# Patient Record
Sex: Male | Born: 1960 | Race: White | Hispanic: No | Marital: Married | Smoking: Never smoker
Health system: Southern US, Community
[De-identification: ages and names within clinical notes are randomized; demographics above are authoritative.]

## PROBLEM LIST (undated history)

## (undated) DIAGNOSIS — T783XXA Angioneurotic edema, initial encounter: Secondary | ICD-10-CM

## (undated) HISTORY — DX: Angioneurotic edema, initial encounter: T78.3XXA

## (undated) HISTORY — PX: TONSILLECTOMY: SUR1361

## (undated) HISTORY — PX: ADENOIDECTOMY: SUR15

---

## 2019-10-06 ENCOUNTER — Other Ambulatory Visit: Payer: Self-pay

## 2020-10-16 DIAGNOSIS — U071 COVID-19: Secondary | ICD-10-CM

## 2020-10-16 HISTORY — DX: COVID-19: U07.1

## 2020-10-19 ENCOUNTER — Other Ambulatory Visit: Payer: Self-pay

## 2020-10-19 ENCOUNTER — Emergency Department
Admission: RE | Admit: 2020-10-19 | Discharge: 2020-10-19 | Disposition: A | Discharge status: Home / Self Care | Payer: 59 | Source: Ambulatory Visit

## 2020-10-19 VITALS — BP 141/92 | HR 110 | Temp 98.4°F | Resp 18

## 2020-10-19 DIAGNOSIS — J0100 Acute maxillary sinusitis, unspecified: Secondary | ICD-10-CM

## 2020-10-19 DIAGNOSIS — U071 COVID-19: Secondary | ICD-10-CM

## 2020-10-19 MED ORDER — AMOXICILLIN-POT CLAVULANATE 875-125 MG PO TABS: 1.0000 | ORAL_TABLET | Freq: Two times a day (BID) | ORAL | 0 refills | Status: DC

## 2020-10-19 NOTE — ED Triage Notes (Signed)
Pt c/o covid sxs (sore throat, productive cough and nasal congestion) since Friday. Took at home test on Saturday am. which came back pos. Felt better Wednesday but getting worse yesterday and today.  Taking mucinex prn.

## 2020-10-19 NOTE — Discharge Instructions (Signed)
  Please take antibiotics as prescribed and be sure to complete entire course even if you start to feel better to ensure infection does not come back.  You may take 500mg  acetaminophen every 4-6 hours or in combination with ibuprofen 400-600mg  every 6-8 hours as needed for pain, inflammation, and fever.  Be sure to well hydrated with clear liquids and get at least 8 hours of sleep at night, preferably more while sick.   Please follow up with family medicine in 1 week if needed or schedule an appointment next week with the [facility] in [place] for in-person evaluation.   CDC recommends isolation for 5 days from symptom onset and fever free for 24 hours without use of fever reducing medication and symptoms improving. Then wear a well fitting mask for 5 days when around others. If not improving, isolate for 10 days and consider reevaluation by a medical provider to rule out a secondary bacterial infection.   Call 911 or have someone drive you to the hospital if symptoms significantly worsening- chest pain, trouble breathing, severe headache, dizziness/passing out, or other new concerning symptoms develop.

## 2020-10-19 NOTE — ED Provider Notes (Signed)
[facility]    CSN: [id number] Arrival date & time: 10/19/20  1050      History   Chief Complaint Chief Complaint  Patient presents with  . Cough    Appt 11am  . Nasal Congestion    HPI JUANITO GONYER is a 60 y.o. male.   HPI LEILAND MIHELICH is a 60 y.o. male presenting to UC with c/o 1 week of worsening sore throat, nasal congestion and sinus pressure/pain.  He took a home COVID test several times since symptom onset, had a POSITIVE result 3 days ago.  Denies fever, chills, chest pain or SOB. No n/v/d. He ahs taken mucinex and tylenol with mild relief. He had two doses of COVID vaccine in April 2021. Others at work have been sick recently.    History reviewed. No pertinent past medical history.  There are no problems to display for this patient.   History reviewed. No pertinent surgical history.     Home Medications    Prior to Admission medications   Medication Sig Start Date End Date Taking? Authorizing Provider  amoxicillin-clavulanate (AUGMENTIN) 875-125 MG tablet Take 1 tablet by mouth 2 (two) times daily. One po bid x 7 days 10/19/20  Yes Lurene Shadow, PA-C    Family History History reviewed. No pertinent family history.  Social History Social History   Tobacco Use  . Smoking status: Never Smoker  . Smokeless tobacco: Never Used  Vaping Use  . Vaping Use: Never used  Substance Use Topics  . Alcohol use: Yes    Comment: socially     Allergies   Patient has no known allergies.   Review of Systems Review of Systems  Constitutional: Negative for chills and fever.  HENT: Positive for congestion, ear pain (bilateral pressure), sinus pressure, sinus pain and sore throat. Negative for trouble swallowing and voice change.   Respiratory: Positive for cough. Negative for shortness of breath.   Cardiovascular: Negative for chest pain and palpitations.  Gastrointestinal: Negative for abdominal pain, diarrhea, nausea and vomiting.   Musculoskeletal: Negative for arthralgias, back pain and myalgias.  Skin: Negative for rash.  Neurological: Positive for headaches (frontal). Negative for dizziness and light-headedness.  All other systems reviewed and are negative.    Physical Exam Triage Vital Signs ED Triage Vitals  Enc Vitals Group     BP 10/19/20 1106 (!) 141/92     Pulse Rate 10/19/20 1106 (!) 110     Resp 10/19/20 1106 18     Temp 10/19/20 1106 98.4 F (36.9 C)     Temp Source 10/19/20 1106 Oral     SpO2 10/19/20 1106 95 %     Weight --      Height --      Head Circumference --      Peak Flow --      Pain Score 10/19/20 1107 7     Pain Loc --      Pain Edu? --      Excl. in GC? --    No data found.  Updated Vital Signs BP (!) 141/92 (BP Location: Left Arm)   Pulse (!) 110   Temp 98.4 F (36.9 C) (Oral)   Resp 18   SpO2 95%   Visual Acuity Right Eye Distance:   Left Eye Distance:   Bilateral Distance:    Right Eye Near:   Left Eye Near:    Bilateral Near:     Physical Exam Vitals and nursing note  reviewed.  Constitutional:      General: He is not in acute distress.    Appearance: Normal appearance. He is well-developed and well-nourished. He is not ill-appearing, toxic-appearing or diaphoretic.  HENT:     Head: Normocephalic and atraumatic.     Right Ear: Tympanic membrane and ear canal normal.     Left Ear: Tympanic membrane and ear canal normal.     Nose: Congestion present.     Right Sinus: Maxillary sinus tenderness present. No frontal sinus tenderness.     Left Sinus: Maxillary sinus tenderness present. No frontal sinus tenderness.     Mouth/Throat:     Lips: Pink.     Mouth: Mucous membranes are moist.     Pharynx: Oropharynx is clear. Uvula midline. No pharyngeal swelling, oropharyngeal exudate, posterior oropharyngeal erythema or uvula swelling.  Eyes:     Extraocular Movements: EOM normal.  Cardiovascular:     Rate and Rhythm: Normal rate and regular rhythm.   Pulmonary:     Effort: Pulmonary effort is normal. No respiratory distress.     Breath sounds: Normal breath sounds. No stridor. No wheezing, rhonchi or rales.  Musculoskeletal:        General: Normal range of motion.     Cervical back: Normal range of motion and neck supple.  Skin:    General: Skin is warm and dry.  Neurological:     Mental Status: He is alert and oriented to person, place, and time.  Psychiatric:        Mood and Affect: Mood and affect normal.        Behavior: Behavior normal.      UC Treatments / Results  Labs (all labs ordered are listed, but only abnormal results are displayed) Labs Reviewed - No data to display  EKG   Radiology No results found.  Procedures Procedures (including critical care time)  Medications Ordered in UC Medications - No data to display  Initial Impression / Assessment and Plan / UC Course  I have reviewed the triage vital signs and the nursing notes.  Pertinent labs & imaging results that were available during my care of the patient were reviewed by me and considered in my medical decision making (see chart for details).     Recent home COVID test positive Worsening sinus pain and pressure, will cover for secondary bacterial infection F/u with PCP or COVID Clinic next week as needed Discussed symptoms that warrant emergent care in the ED. AVS given  Final Clinical Impressions(s) / UC Diagnoses   Final diagnoses:  COVID  Acute non-recurrent maxillary sinusitis     Discharge Instructions      Please take antibiotics as prescribed and be sure to complete entire course even if you start to feel better to ensure infection does not come back.  You may take 500mg  acetaminophen every 4-6 hours or in combination with ibuprofen 400-600mg  every 6-8 hours as needed for pain, inflammation, and fever.  Be sure to well hydrated with clear liquids and get at least 8 hours of sleep at night, preferably more while sick.    Please follow up with family medicine in 1 week if needed or schedule an appointment next week with the [facility] in [place] for in-person evaluation.   CDC recommends isolation for 5 days from symptom onset and fever free for 24 hours without use of fever reducing medication and symptoms improving. Then wear a well fitting mask for 5 days when around others. If  not improving, isolate for 10 days and consider reevaluation by a medical provider to rule out a secondary bacterial infection.   Call 911 or have someone drive you to the hospital if symptoms significantly worsening- chest pain, trouble breathing, severe headache, dizziness/passing out, or other new concerning symptoms develop.     ED Prescriptions    Medication Sig Dispense Auth. Provider   amoxicillin-clavulanate (AUGMENTIN) 875-125 MG tablet Take 1 tablet by mouth 2 (two) times daily. One po bid x 7 days 14 tablet Lurene Shadow, [place]     PDMP not reviewed this encounter.   Lurene Shadow, [place] 10/19/20 1304

## 2020-10-26 ENCOUNTER — Emergency Department
Admission: EM | Admit: 2020-10-26 | Discharge: 2020-10-26 | Disposition: A | Discharge status: Home / Self Care | Payer: 59 | Source: Home / Self Care

## 2020-10-26 ENCOUNTER — Other Ambulatory Visit: Payer: Self-pay

## 2020-10-26 DIAGNOSIS — J320 Chronic maxillary sinusitis: Secondary | ICD-10-CM

## 2020-10-26 MED ORDER — CEFDINIR 300 MG PO CAPS: 300.0000 mg | ORAL_CAPSULE | Freq: Two times a day (BID) | ORAL | 0 refills | Status: DC

## 2020-10-26 MED ORDER — PREDNISONE 10 MG PO TABS: 20.0000 mg | ORAL_TABLET | Freq: Every day | ORAL | 0 refills | Status: DC

## 2020-10-26 NOTE — ED Triage Notes (Signed)
Patient presents to Urgent Care with complaints of continued sinus pressure, sore throat, and cough since he got covid just over 2 weeks ago. Patient reports he was given an antibiotic last week and he just completed it, thinks he might need another course of antibiotics to get over covid.

## 2021-01-07 ENCOUNTER — Other Ambulatory Visit: Payer: Self-pay

## 2021-01-07 ENCOUNTER — Emergency Department
Admission: RE | Admit: 2021-01-07 | Discharge: 2021-01-07 | Disposition: A | Discharge status: Home / Self Care | Payer: 59 | Source: Ambulatory Visit

## 2021-01-07 VITALS — BP 119/80 | HR 79 | Temp 98.5°F | Resp 16

## 2021-01-07 DIAGNOSIS — J0140 Acute pansinusitis, unspecified: Secondary | ICD-10-CM | POA: Diagnosis not present

## 2021-01-07 DIAGNOSIS — J209 Acute bronchitis, unspecified: Secondary | ICD-10-CM

## 2021-01-07 MED ORDER — PREDNISONE 10 MG (21) PO TBPK: ORAL_TABLET | Freq: Every day | ORAL | 0 refills | Status: DC

## 2021-01-07 MED ORDER — AMOXICILLIN-POT CLAVULANATE 875-125 MG PO TABS: 1.0000 | ORAL_TABLET | Freq: Two times a day (BID) | ORAL | 0 refills | Status: AC

## 2021-01-07 NOTE — ED Triage Notes (Signed)
Patient presents to Urgent Care with complaints of cough and sinus congestion with drainage since about 5 days ago. Patient reports he was up all night coughing, thinks he has sinusitis.

## 2021-01-07 NOTE — Discharge Instructions (Addendum)
I have sent in Augmentin for you to take twice a day for 7 days. ° °I have sent in a prednisone taper for you to take for 6 days. 6 tablets on day one, 5 tablets on day two, 4 tablets on day three, 3 tablets on day four, 2 tablets on day five, and 1 tablet on day six. ° °Follow up with this office or with primary care if symptoms are persisting. ° °Follow up in the ER for high fever, trouble swallowing, trouble breathing, other concerning symptoms. ° °

## 2021-01-07 NOTE — ED Provider Notes (Signed)
[facility]    CSN: [id number] Arrival date & time: 01/07/21  0943      History   Chief Complaint Chief Complaint  Patient presents with  . Appointment    1000  . Cough    HPI Timothy Carr is a 60 y.o. male.   Reports cough, sinus congestion and postnasal drip for the last 5 days.  States that he was up all night coughing.  Reports he thinks he is sinusitis.  Has taken OTC cough medication without relief.  Denies sick contacts.  Has visited history of COVID in February 2022.  Has completed COVID vaccines, no booster.  Has completed flu vaccine.  Reports that he does have seasonal allergies usually.  Denies headache, nausea, vomiting, diarrhea, abdominal pain, rash, fever, other symptoms.  As per HPI    The history is provided by the patient.  Cough   History reviewed. No pertinent past medical history.  There are no problems to display for this patient.   History reviewed. No pertinent surgical history.     Home Medications    Prior to Admission medications   Medication Sig Start Date End Date Taking? Authorizing Provider  amoxicillin-clavulanate (AUGMENTIN) 875-125 MG tablet Take 1 tablet by mouth 2 (two) times daily for 7 days. 01/07/21 01/14/21 Yes Moshe Cipro, NP  predniSONE (STERAPRED UNI-PAK 21 TAB) 10 MG (21) TBPK tablet Take by mouth daily for 6 days. Take 6 tablets on day 1, 5 tablets on day 2, 4 tablets on day 3, 3 tablets on day 4, 2 tablets on day 5, 1 tablet on day 6 01/07/21 01/13/21 Yes Moshe Cipro, NP    Family History Family History  Problem Relation Age of Onset  . Healthy Mother   . Heart failure Father     Social History Social History   Tobacco Use  . Smoking status: Never Smoker  . Smokeless tobacco: Never Used  Vaping Use  . Vaping Use: Never used  Substance Use Topics  . Alcohol use: Yes    Comment: socially     Allergies   Patient has no known allergies.   Review of Systems Review of Systems   Respiratory: Positive for cough.      Physical Exam Triage Vital Signs ED Triage Vitals  Enc Vitals Group     BP 01/07/21 0957 119/80     Pulse Rate 01/07/21 0957 79     Resp 01/07/21 0957 16     Temp 01/07/21 0957 98.5 F (36.9 C)     Temp Source 01/07/21 0957 Oral     SpO2 01/07/21 0957 96 %     Weight --      Height --      Head Circumference --      Peak Flow --      Pain Score 01/07/21 0955 0     Pain Loc --      Pain Edu? --      Excl. in GC? --    No data found.  Updated Vital Signs BP 119/80 (BP Location: Left Arm)   Pulse 79   Temp 98.5 F (36.9 C) (Oral)   Resp 16   SpO2 96%   Visual Acuity Right Eye Distance:   Left Eye Distance:   Bilateral Distance:    Right Eye Near:   Left Eye Near:    Bilateral Near:     Physical Exam Vitals and nursing note reviewed.  Constitutional:      General:  He is not in acute distress.    Appearance: Normal appearance. He is well-developed. He is ill-appearing.  HENT:     Head: Normocephalic and atraumatic.     Right Ear: Tympanic membrane, ear canal and external ear normal.     Left Ear: Tympanic membrane, ear canal and external ear normal.     Nose: Congestion present.     Comments: Frontal and maxillary sinus tenderness    Mouth/Throat:     Mouth: Mucous membranes are moist.     Pharynx: Posterior oropharyngeal erythema present.     Comments: Cobblestoning present Eyes:     Extraocular Movements: Extraocular movements intact.     Conjunctiva/sclera: Conjunctivae normal.     Pupils: Pupils are equal, round, and reactive to light.  Cardiovascular:     Rate and Rhythm: Normal rate and regular rhythm.     Heart sounds: Normal heart sounds. No murmur heard.   Pulmonary:     Effort: Pulmonary effort is normal. No respiratory distress.     Breath sounds: No stridor. Wheezing and rhonchi present. No rales.  Chest:     Chest wall: No tenderness.  Abdominal:     Palpations: Abdomen is soft.     Tenderness:  There is no abdominal tenderness.  Musculoskeletal:        General: Normal range of motion.     Cervical back: Normal range of motion and neck supple.  Skin:    General: Skin is warm and dry.     Capillary Refill: Capillary refill takes less than 2 seconds.  Neurological:     General: No focal deficit present.     Mental Status: He is alert and oriented to person, place, and time.  Psychiatric:        Mood and Affect: Mood normal.        Behavior: Behavior normal.        Thought Content: Thought content normal.      UC Treatments / Results  Labs (all labs ordered are listed, but only abnormal results are displayed) Labs Reviewed - No data to display  EKG   Radiology No results found.  Procedures Procedures (including critical care time)  Medications Ordered in UC Medications - No data to display  Initial Impression / Assessment and Plan / UC Course  I have reviewed the triage vital signs and the nursing notes.  Pertinent labs & imaging results that were available during my care of the patient were reviewed by me and considered in my medical decision making (see chart for details).    Acute pansinusitis Acute bronchitis  Prescribed Augmentin 875 twice daily x7 days Prescribe steroid taper May continue OTC Zyrtec and Flonase for symptomatic relief Follow-up with no improvement over the next 48 hours after starting medication Follow-up with PCP as needed  Final Clinical Impressions(s) / UC Diagnoses   Final diagnoses:  Acute non-recurrent pansinusitis  Acute bronchitis, unspecified organism     Discharge Instructions     I have sent in Augmentin for you to take twice a day for 7 days.  I have sent in a prednisone taper for you to take for 6 days. 6 tablets on day one, 5 tablets on day two, 4 tablets on day three, 3 tablets on day four, 2 tablets on day five, and 1 tablet on day six.  Follow up with this office or with primary care if symptoms are  persisting.  Follow up in the ER for high fever, trouble swallowing,  trouble breathing, other concerning symptoms.     ED Prescriptions    Medication Sig Dispense Auth. Provider   amoxicillin-clavulanate (AUGMENTIN) 875-125 MG tablet Take 1 tablet by mouth 2 (two) times daily for 7 days. 14 tablet Moshe Cipro, NP   predniSONE (STERAPRED UNI-PAK 21 TAB) 10 MG (21) TBPK tablet Take by mouth daily for 6 days. Take 6 tablets on day 1, 5 tablets on day 2, 4 tablets on day 3, 3 tablets on day 4, 2 tablets on day 5, 1 tablet on day 6 21 tablet Moshe Cipro, NP     PDMP not reviewed this encounter.   Moshe Cipro, NP 01/07/21 1010

## 2021-01-13 ENCOUNTER — Emergency Department (INDEPENDENT_AMBULATORY_CARE_PROVIDER_SITE_OTHER): Payer: 59

## 2021-01-13 ENCOUNTER — Encounter: Payer: Self-pay | Admitting: Emergency Medicine

## 2021-01-13 ENCOUNTER — Emergency Department: Admit: 2021-01-13 | Discharge status: Absent without leave | Payer: Self-pay

## 2021-01-13 ENCOUNTER — Other Ambulatory Visit: Payer: Self-pay

## 2021-01-13 ENCOUNTER — Emergency Department
Admission: EM | Admit: 2021-01-13 | Discharge: 2021-01-13 | Disposition: A | Discharge status: Home / Self Care | Payer: 59 | Source: Home / Self Care

## 2021-01-13 DIAGNOSIS — R059 Cough, unspecified: Secondary | ICD-10-CM

## 2021-01-13 DIAGNOSIS — J129 Viral pneumonia, unspecified: Secondary | ICD-10-CM

## 2021-01-13 DIAGNOSIS — U099 Post covid-19 condition, unspecified: Secondary | ICD-10-CM

## 2021-01-13 DIAGNOSIS — R9389 Abnormal findings on diagnostic imaging of other specified body structures: Secondary | ICD-10-CM | POA: Diagnosis not present

## 2021-01-13 DIAGNOSIS — R0602 Shortness of breath: Secondary | ICD-10-CM | POA: Diagnosis not present

## 2021-01-13 DIAGNOSIS — R0781 Pleurodynia: Secondary | ICD-10-CM

## 2021-01-13 DIAGNOSIS — R911 Solitary pulmonary nodule: Secondary | ICD-10-CM

## 2021-01-13 MED ORDER — IPRATROPIUM-ALBUTEROL 0.5-2.5 (3) MG/3ML IN SOLN: 3.0000 mL | RESPIRATORY_TRACT | 0 refills | Status: DC | PRN

## 2021-01-13 MED ORDER — AZITHROMYCIN 250 MG PO TABS: 250.0000 mg | ORAL_TABLET | Freq: Every day | ORAL | 0 refills | Status: DC

## 2021-01-13 MED ORDER — BENZONATATE 100 MG PO CAPS: 100.0000 mg | ORAL_CAPSULE | Freq: Three times a day (TID) | ORAL | 0 refills | Status: DC

## 2021-01-13 MED ORDER — GUAIFENESIN-CODEINE 100-10 MG/5ML PO SYRP
5.0000 mL | ORAL_SOLUTION | Freq: Three times a day (TID) | ORAL | 0 refills | Status: DC | PRN
Start: 2021-01-13 — End: 2022-09-11

## 2021-01-13 MED ORDER — PREDNISONE 10 MG (21) PO TBPK: ORAL_TABLET | Freq: Every day | ORAL | 0 refills | Status: AC

## 2021-01-13 MED ORDER — METHYLPREDNISOLONE SODIUM SUCC 125 MG IJ SOLR
125.0000 mg | Freq: Once | INTRAMUSCULAR | Status: AC
  Administered 2021-01-13: 125 mg via INTRAMUSCULAR

## 2021-01-13 NOTE — ED Triage Notes (Signed)
return visit for worsening cough 2 doses of antibiotic  Finished steroids yesterday  OTC sudafed  No OTC meds for cough

## 2021-01-13 NOTE — ED Notes (Signed)
Pt taught how to use IS & how to set up nebulizer- pt also set up w/ a PCP

## 2021-01-13 NOTE — Discharge Instructions (Addendum)
I have sent in a prednisone taper for you to take for 6 days. 6 tablets on day one, 5 tablets on day two, 4 tablets on day three, 3 tablets on day four, 2 tablets on day five, and 1 tablet on day six.  I have sent in tessalon perles for you to use one capsule every 8 hours as needed for cough.  I have sent in cough syrup for you to take. This medication can make you sleepy. Do not drive while taking this medication.  I have sent in azithromycin for you to take. Take 2 tablets today, then one tablet daily for the next 4 days.  So sent in DuoNeb solution for you to use an air nebulizer.  You may use this every 4 hours for cough, shortness of breath, wheezing.  I would use this every 4 hours for the next 24 hours while you are awake.  I have sent you home with an incentive spirometer, you may use this every 4 hours as well, I would use this right before you use your nebulizer.  Get established with primary care, they will need to do a referral for you for pulmonology.  I will put in a referral for pulmonology today, your insurance may require that it comes in from primary care.  If any of your symptoms are getting worse, if the fatigue is worse, if you begin to have a high fever, if you are having shortness of breath, trouble swallowing, follow-up in the ER for further treatment.

## 2021-01-13 NOTE — ED Provider Notes (Signed)
[facility]    CSN: [id number] Arrival date & time: 01/13/21  1046      History   Chief Complaint Chief Complaint  Patient presents with  . Cough    HPI Timothy Carr is a 60 y.o. male.   Patient returns today for worsening cough since last visit.  Reports that he is still experiencing cough, headache, shortness of breath, chest and back pain with coughing.  Was seen in this office on 01/07/2021 by me.  Was treated with Augmentin, steroids.  States that he finished the steroids yesterday.  States that he has 2 doses left of Augmentin.  States that he is not feeling any better, states that he did not notice much improvement while taking the medication either.  Has been using Sudafed for congestion.  Has not taken anything over-the-counter for cough.  He did have COVID in February 2022, has had COVID vaccines, no booster.  Has completed flu vaccine this year.  Denies fever, sinus pressure, sinus pain, nasal congestion, abdominal pain, nausea, vomiting, diarrhea, rash, other symptoms.  ROS per HPI  The history is provided by the patient.    Past Medical History:  Diagnosis Date  . COVID-19 10/2020    There are no problems to display for this patient.   History reviewed. No pertinent surgical history.     Home Medications    Prior to Admission medications   Medication Sig Start Date End Date Taking? Authorizing Provider  amoxicillin-clavulanate (AUGMENTIN) 875-125 MG tablet Take 1 tablet by mouth 2 (two) times daily for 7 days. 01/07/21 01/14/21 Yes Moshe Cipro, NP  azithromycin (ZITHROMAX) 250 MG tablet Take 1 tablet (250 mg total) by mouth daily. Take first 2 tablets together, then 1 every day until finished. 01/13/21  Yes Moshe Cipro, NP  benzonatate (TESSALON) 100 MG capsule Take 1 capsule (100 mg total) by mouth every 8 (eight) hours. 01/13/21  Yes Moshe Cipro, NP  guaiFENesin-codeine (ROBITUSSIN AC) 100-10 MG/5ML syrup Take 5 mLs by mouth  3 (three) times daily as needed for cough. 01/13/21  Yes Moshe Cipro, NP  ipratropium-albuterol (DUONEB) 0.5-2.5 (3) MG/3ML SOLN Take 3 mLs by nebulization every 4 (four) hours as needed (cough, wheezing, shortness of breath). 01/13/21  Yes Moshe Cipro, NP  predniSONE (STERAPRED UNI-PAK 21 TAB) 10 MG (21) TBPK tablet Take by mouth daily for 6 days. Take 6 tablets on day 1, 5 tablets on day 2, 4 tablets on day 3, 3 tablets on day 4, 2 tablets on day 5, 1 tablet on day 6 01/13/21 01/19/21 Yes Moshe Cipro, NP    Family History Family History  Problem Relation Age of Onset  . Healthy Mother   . Heart failure Father     Social History Social History   Tobacco Use  . Smoking status: Never Smoker  . Smokeless tobacco: Never Used  Vaping Use  . Vaping Use: Never used  Substance Use Topics  . Alcohol use: Yes    Comment: socially  . Drug use: Never     Allergies   Patient has no known allergies.   Review of Systems Review of Systems   Physical Exam Triage Vital Signs ED Triage Vitals  Enc Vitals Group     BP 01/13/21 1118 128/85     Pulse Rate 01/13/21 1118 83     Resp 01/13/21 1118 16     Temp 01/13/21 1118 98.3 F (36.8 C)     Temp Source 01/13/21 1118 Oral  SpO2 01/13/21 1118 97 %     Weight 01/13/21 1119 281 lb (127.5 kg)     Height 01/13/21 1119 6\' 4"  (1.93 m)     Head Circumference --      Peak Flow --      Pain Score 01/13/21 1119 0     Pain Loc --      Pain Edu? --      Excl. in GC? --    No data found.  Updated Vital Signs BP 128/85 (BP Location: Right Arm)   Pulse 83   Temp 98.3 F (36.8 C) (Oral)   Resp 16   Ht 6\' 4"  (1.93 m)   Wt 281 lb (127.5 kg)   SpO2 97%   BMI 34.20 kg/m   Visual Acuity Right Eye Distance:   Left Eye Distance:   Bilateral Distance:    Right Eye Near:   Left Eye Near:    Bilateral Near:     Physical Exam Vitals and nursing note reviewed.  Constitutional:      General: He is not in acute  distress.    Appearance: He is well-developed. He is ill-appearing.  HENT:     Head: Normocephalic and atraumatic.     Right Ear: Tympanic membrane, ear canal and external ear normal.     Left Ear: Tympanic membrane, ear canal and external ear normal.     Nose: Nose normal.     Mouth/Throat:     Mouth: Mucous membranes are moist.     Pharynx: Posterior oropharyngeal erythema present.  Eyes:     Extraocular Movements: Extraocular movements intact.     Conjunctiva/sclera: Conjunctivae normal.     Pupils: Pupils are equal, round, and reactive to light.  Cardiovascular:     Rate and Rhythm: Normal rate and regular rhythm.     Heart sounds: Normal heart sounds. No murmur heard.   Pulmonary:     Effort: Pulmonary effort is normal. No respiratory distress.     Breath sounds: No stridor. Wheezing and rhonchi present. No rales.     Comments: More intense rhonchi and wheezing than at previous visit last week Chest:     Chest wall: No tenderness.  Abdominal:     Palpations: Abdomen is soft.     Tenderness: There is no abdominal tenderness.  Musculoskeletal:        General: Normal range of motion.     Cervical back: Normal range of motion and neck supple.  Lymphadenopathy:     Cervical: Cervical adenopathy present.  Skin:    General: Skin is warm and dry.     Capillary Refill: Capillary refill takes less than 2 seconds.  Neurological:     General: No focal deficit present.     Mental Status: He is alert and oriented to person, place, and time.  Psychiatric:        Mood and Affect: Mood normal.        Behavior: Behavior normal.        Thought Content: Thought content normal.      UC Treatments / Results  Labs (all labs ordered are listed, but only abnormal results are displayed) Labs Reviewed - No data to display  EKG   Radiology DG Chest 2 View  Result Date: 01/13/2021 CLINICAL DATA:  Worsening cough.  Shortness of breath. EXAM: CHEST - 2 VIEW COMPARISON:  June 18, 2019  FINDINGS: The heart, hila, mediastinum, lungs, and pleura are normal. A rounded density projects over and  upper thoracic vertebral body. No other abnormalities. IMPRESSION: A rounded density projects over the upper thoracic vertebral body on the lateral view. This is nonspecific. Recommend CT imaging. No other abnormalities identified. Electronically Signed   By: Gerome Sam III M.D   On: 01/13/2021 12:10   CT Chest Wo Contrast  Result Date: 01/13/2021 CLINICAL DATA:  Persistent cough. Follow-up abnormal chest x-ray finding. EXAM: CT CHEST WITHOUT CONTRAST TECHNIQUE: Multidetector CT imaging of the chest was performed following the standard protocol without IV contrast. COMPARISON:  Chest x-ray Jan 13, 2021 FINDINGS: Cardiovascular: The thoracic aorta is normal. Central pulmonary arteries are normal. Calcified atherosclerosis is seen in the proximal LAD. Heart size is normal. Mediastinum/Nodes: No pleural or pericardial effusions. The thyroid is normal. The esophagus is normal. Chest wall is normal. No adenopathy. Lungs/Pleura: Central airways are normal. No pneumothorax. Scattered subsegmental atelectasis on the left. Ground-glass posteriorly in the left base on series 4, image 104 appears to represent focal atelectasis on coronal imaging. No suspicious nodules, masses, or infiltrates on the left. Scattered subsegmental atelectasis on the right as well. Focal ground-glass in the periphery of the right lung on coronal image 79 and axial image 53 could represent atelectasis or focal infiltrate/infection. A nodule in the anterior right lung on series 4, image 91 and coronal image 120 measures 7 by 5 by 6 mm with a mean diameter of 6 mm. Focal ground-glass posteriorly in the right base on series 4, image 100 could represent focal atelectasis or developing infiltrate. Other smaller regions of focal ground-glass are identified, most of which appear to represent atelectasis. No masses. Upper Abdomen: No acute  abnormality. Musculoskeletal: The rounded high density projected over the upper thoracic vertebral body on the lateral view of today's x-ray correlates with bony bridging between a left medial rib in the adjacent vertebral body as seen on axial image 43. IMPRESSION: 1. 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 2. There is a 6 mm nodule in the right lung as above. Non-contrast chest CT at 6 months is recommended. If the nodule is stable at time of repeat CT, then future CT at 18-24 months (from today's scan) is considered optional for low-risk patients, but is recommended for high-risk patients. This recommendation follows the consensus statement: Guidelines for Management of Incidental Pulmonary Nodules Detected on CT Images: From the Fleischner Society 2017; Radiology 2017; 284:228-243. 3. Scattered regions of ground-glass in the lungs, right greater than left. Most appear to represent atelectasis. Others could represent atelectasis or an inflammatory/infectious process. Recommend attention on the follow-up CT scan. 4. Calcified atherosclerosis in the proximal LAD. Electronically Signed   By: Gerome Sam III M.D   On: 01/13/2021 13:15    Procedures Procedures (including critical care time)  Medications Ordered in UC Medications  methylPREDNISolone sodium succinate (SOLU-MEDROL) 125 mg/2 mL injection 125 mg (125 mg Intramuscular Given 01/13/21 1349)    Initial Impression / Assessment and Plan / UC Course  I have reviewed the triage vital signs and the nursing notes.  Pertinent labs & imaging results that were available during my care of the patient were reviewed by me and considered in my medical decision making (see chart for details).    Viral pneumonia Long COVID Lung nodule seen on imaging study Shortness of breath Pleuritic pain  Chest x-ray today  shows "A rounded density projects over the upper thoracic  vertebral body on the lateral view. This is nonspecific. Recommend CT imaging." Patient does not have primary care or pulmonology, decided to do CT in office today Chest CT in office today shows "1. 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 2. There is a 6 mm nodule in the right lung as above. Non-contrast chest CT at 6 months is recommended. If the nodule is stable at time of repeat CT, then future CT at 18-24 months (from today's scan) is considered optional for low-risk patients, but is recommended for high-risk patients. This recommendation follows the consensus statement: Guidelines for Management of Incidental Pulmonary Nodules Detected on CT Images: From the Fleischner Society 2017; Radiology 2017; 284:228-243. 3. Scattered regions of ground-glass in the lungs, right greater than left. Most appear to represent atelectasis. Others could represent atelectasis or an inflammatory/infectious process. Recommend attention on the follow-up CT scan. 4. Calcified atherosclerosis in the proximal LAD." Prescribed azithromycin Prescribed benzonatate to use during the day Prescribed Cheratussin to use for cough at night Sedation precautions given Prescribe DuoNeb Nebulizer machine given in office today Prescribed another round of steroids today Solu-Medrol 125 given in office today for wheezing and shortness of breath Established patient with primary care in this building Discussed that he will also need to establish care with pulmonologist Check pulse ox and heart rate at home Incentive spirometry in office and sent home Follow-up with primary care as scheduled Follow-up with pulmonology as soon as you can Follow-up in the ER with any worsening symptoms including new fever, shortness of breath,  decreased oxygen saturations, consistent heart rate over 100, other concerning symptoms  Final Clinical Impressions(s) / UC Diagnoses   Final diagnoses:  Pneumonia, viral  Long COVID  Lung nodule seen on imaging study  SOB (shortness of breath)  Pleuritic pain     Discharge Instructions     I have sent in a prednisone taper for you to take for 6 days. 6 tablets on day one, 5 tablets on day two, 4 tablets on day three, 3 tablets on day four, 2 tablets on day five, and 1 tablet on day six.  I have sent in tessalon perles for you to use one capsule every 8 hours as needed for cough.  I have sent in cough syrup for you to take. This medication can make you sleepy. Do not drive while taking this medication.  I have sent in azithromycin for you to take. Take 2 tablets today, then one tablet daily for the next 4 days.  So sent in DuoNeb solution for you to use an air nebulizer.  You may use this every 4 hours for cough, shortness of breath, wheezing.  I would use this every 4 hours for the next 24 hours while you are awake.  I have sent you home with an incentive spirometer, you may use this every 4 hours as well, I would use this right before you use your nebulizer.  Get established with primary care, they will need to do a referral for you for pulmonology.  I will put in a referral for pulmonology today, your insurance may require that it comes in from primary care.  If any of your symptoms are getting worse, if the fatigue is worse, if you begin to have a high fever, if you are having shortness of breath, trouble swallowing, follow-up in the ER for further treatment.  ED Prescriptions    Medication Sig Dispense Auth. Provider   predniSONE (STERAPRED UNI-PAK 21 TAB) 10 MG (21) TBPK tablet Take by mouth daily for 6 days. Take 6 tablets on day 1, 5 tablets on day 2, 4 tablets on day 3, 3 tablets on day 4, 2 tablets on day 5, 1 tablet on day 6 21 tablet Moshe Cipro, NP    benzonatate (TESSALON) 100 MG capsule Take 1 capsule (100 mg total) by mouth every 8 (eight) hours. 21 capsule Moshe Cipro, NP   azithromycin (ZITHROMAX) 250 MG tablet Take 1 tablet (250 mg total) by mouth daily. Take first 2 tablets together, then 1 every day until finished. 6 tablet Moshe Cipro, NP   guaiFENesin-codeine (ROBITUSSIN AC) 100-10 MG/5ML syrup Take 5 mLs by mouth 3 (three) times daily as needed for cough. 120 mL Moshe Cipro, NP   ipratropium-albuterol (DUONEB) 0.5-2.5 (3) MG/3ML SOLN Take 3 mLs by nebulization every 4 (four) hours as needed (cough, wheezing, shortness of breath). 360 mL Moshe Cipro, NP     PDMP not reviewed this encounter.   Moshe Cipro, NP 01/13/21 1549

## 2021-01-15 ENCOUNTER — Ambulatory Visit (INDEPENDENT_AMBULATORY_CARE_PROVIDER_SITE_OTHER): Payer: 59 | Admitting: Family Medicine

## 2021-01-15 ENCOUNTER — Other Ambulatory Visit: Payer: Self-pay

## 2021-01-15 ENCOUNTER — Encounter: Payer: Self-pay | Admitting: Family Medicine

## 2021-01-15 DIAGNOSIS — R911 Solitary pulmonary nodule: Secondary | ICD-10-CM

## 2021-01-15 DIAGNOSIS — J189 Pneumonia, unspecified organism: Secondary | ICD-10-CM | POA: Insufficient documentation

## 2021-01-15 NOTE — Assessment & Plan Note (Signed)
He will complete course of azithromycin and prednisone.  Continue cough syrup and nebulizer as needed.  Follow up with me in 1 week.

## 2021-01-15 NOTE — Patient Instructions (Signed)
Great to meet you today! Complete antibiotic and prednisone.  Let me know if symptoms are worsening.   See me again in about 1 week.

## 2021-01-15 NOTE — Assessment & Plan Note (Signed)
Will plan to repeat CT in 6 months.

## 2021-01-15 NOTE — Progress Notes (Signed)
Timothy Carr - 60 y.o. male MRN [id number]  Date of birth: 1961-08-12  Subjective Chief Complaint  Patient presents with  . Establish Care    HPI Timothy Carr is a 60 y.o. male here today for initial visit.  He has been seen x2 at urgent care over the past couple of weeks for cough and shortness of breath with wheezing.  Treated for PNA initially with augmentin and prednisone.  Didn't have much improvement with this and azithromycin was added which he started 2 days ago.  He is on a 2nd course of prednisone as well.  He has tessalon perles and robitussin ac for cough.  He is using duoneb as needed at home.  Continues with cough and mild wheezing.  Denies fever, chills, fatigue.   Incidental 61mm nodule noted on imaging as well.  No prior history of smoking.  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   ROS:  A comprehensive ROS was completed and negative except as noted per HPI  No Known Allergies  Past Medical History:  Diagnosis Date  . COVID-19 10/2020    No past surgical history on file.  Social History   Socioeconomic History  . Marital status: Married    Spouse name: Not on file  . Number of children: Not on file  . Years of education: Not on file  . Highest education level: Not on file  Occupational History  . Not on file  Tobacco Use  . Smoking status: Never Smoker  . Smokeless tobacco: Never Used  Vaping Use  . Vaping Use: Never used  Substance and Sexual Activity  . Alcohol use: Yes    Comment: socially  . Drug use: Never  . Sexual activity: Not on file  Other Topics Concern  . Not on file  Social History Narrative  . Not on file   Social Determinants of Health   Financial Resource Strain: Not on file  Food Insecurity: Not on file  Transportation Needs: Not on file  Physical Activity: Not on file  Stress: Not on file  Social Connections: Not on file    Family History  Problem  Relation Age of Onset  . Healthy Mother   . Heart failure Father     Health Maintenance  Topic Date Due  . Hepatitis C Screening  Never done  . HIV Screening  Never done  . TETANUS/TDAP  Never done  . COVID-19 Vaccine (3 - Booster for Pfizer series) 01/31/2021 (Originally 06/19/2020)  . COLONOSCOPY (Pts 45-45yrs Insurance coverage will need to be confirmed)  01/15/2022 (Originally 05/20/2005)  . INFLUENZA VACCINE  04/15/2021  . HPV VACCINES  Aged Out     ----------------------------------------------------------------------------------------------------------------------------------------------------------------------------------------------------------------- Physical Exam BP 130/86 (BP Location: Left Arm, Patient Position: Sitting, Cuff Size: Large)   Pulse (!) 104   Temp 98.9 F (37.2 C) (Oral)   Resp (!) 22   Ht 6\' 4"  (1.93 m)   Wt 284 lb (128.8 kg)   SpO2 97%   BMI 34.57 kg/m   Physical Exam Constitutional:      Appearance: Normal appearance.  HENT:     Head: Normocephalic and atraumatic.  Eyes:     General: No scleral icterus. Cardiovascular:     Rate and Rhythm: Normal rate and regular rhythm.  Pulmonary:     Effort: Pulmonary effort is normal.     Comments: Scattered rhonchi bilaterally.  Musculoskeletal:     Cervical back: Neck supple.  Skin:  General: Skin is warm and dry.  Neurological:     Mental Status: He is alert.  Psychiatric:        Mood and Affect: Mood normal.        Behavior: Behavior normal.     ------------------------------------------------------------------------------------------------------------------------------------------------------------------------------------------------------------------- Assessment and Plan  Pneumonia He will complete course of azithromycin and prednisone.  Continue cough syrup and nebulizer as needed.  Follow up with me in 1 week.   Pulmonary nodule Will plan to repeat CT in 6 months.    No orders  of the defined types were placed in this encounter.   Return in about 1 week (around 01/22/2021) for Pneumonia.    This visit occurred during the SARS-CoV-2 public health emergency.  Safety protocols were in place, including screening questions prior to the visit, additional usage of staff PPE, and extensive cleaning of exam room while observing appropriate contact time as indicated for disinfecting solutions.

## 2021-01-24 ENCOUNTER — Ambulatory Visit: Payer: 59 | Admitting: Family Medicine

## 2021-08-16 ENCOUNTER — Encounter (HOSPITAL_COMMUNITY): Payer: Self-pay | Admitting: Radiology

## 2021-09-13 IMAGING — CT CT CHEST W/O CM
2 of 4 series · 14 of 36 positions shown, 17 images · non-contrast
Comparison: Chest x-ray January 13, 2021

CLINICAL DATA: Persistent cough. Follow-up abnormal chest x-ray
finding.

EXAM:
CT CHEST WITHOUT CONTRAST
TECHNIQUE: Multidetector CT imaging of the chest was performed following the
standard protocol without IV contrast.

[Series 2: thorax · axial · 0.88mm/px · z∈[-324,-22]mm · 11 of 179 slices shown, 14 images]
[im 14/179  mediastinal]
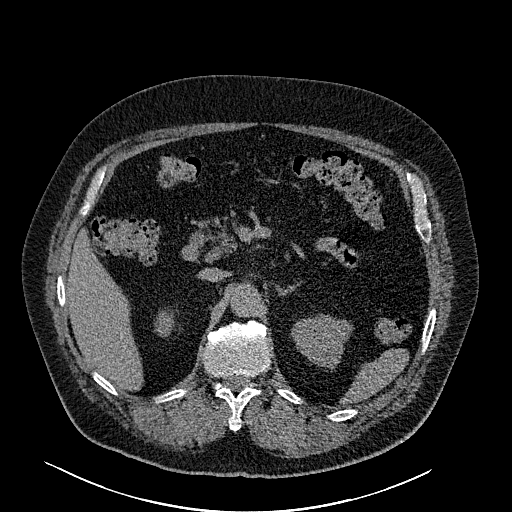
[im 14/179  lung]
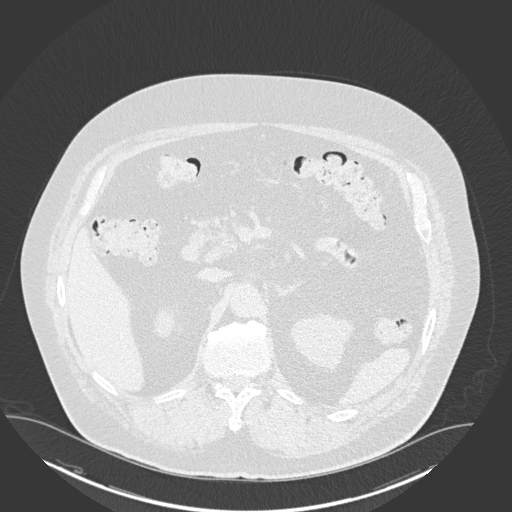
[im 28/179  lung]
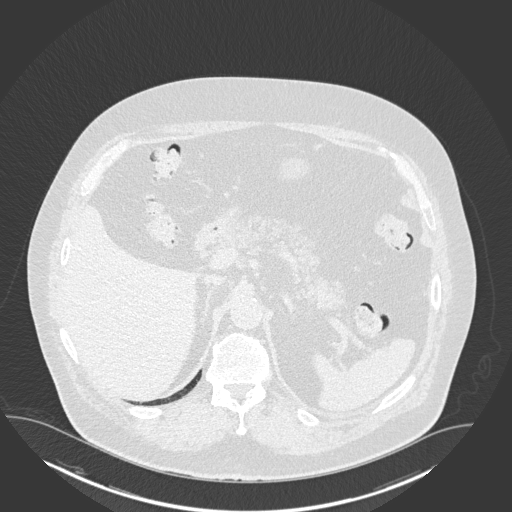
[im 42/179  lung]
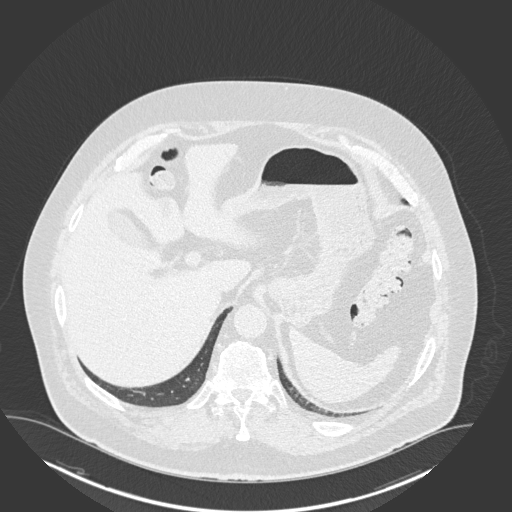
[im 55/179  lung]
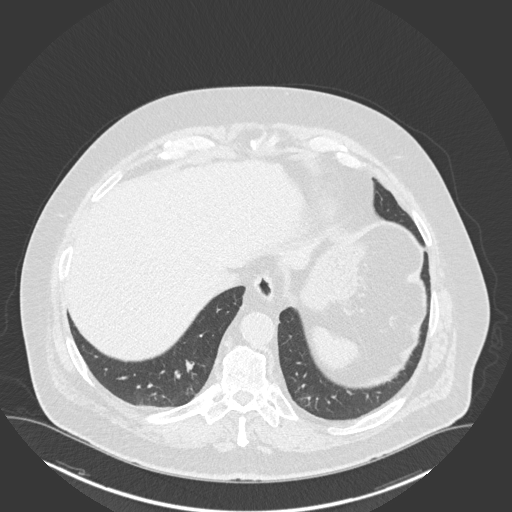
[im 69/179  mediastinal]
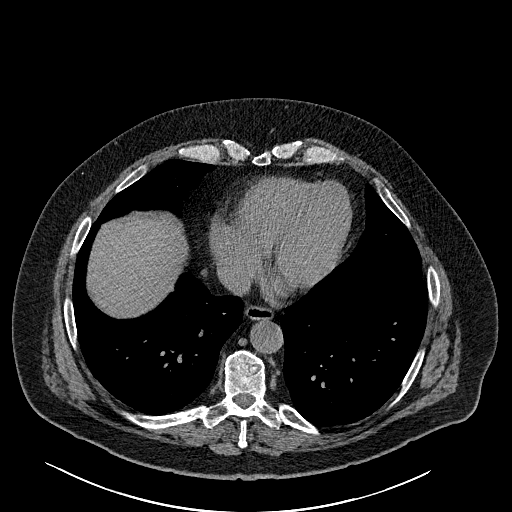
[im 69/179  lung]
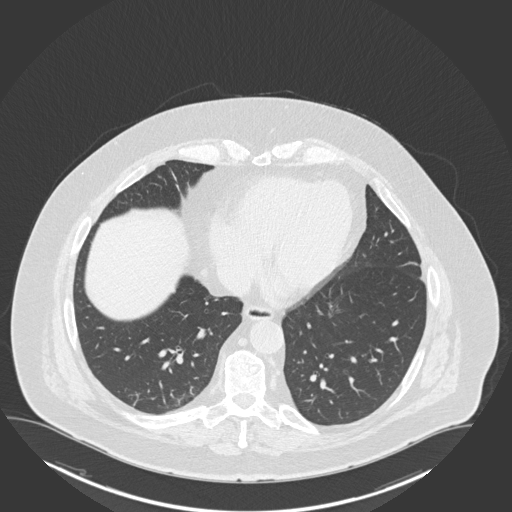
[im 96/179  lung]
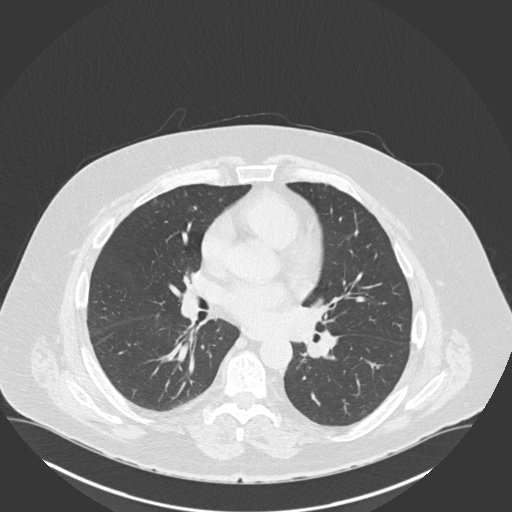
[im 110/179  lung]
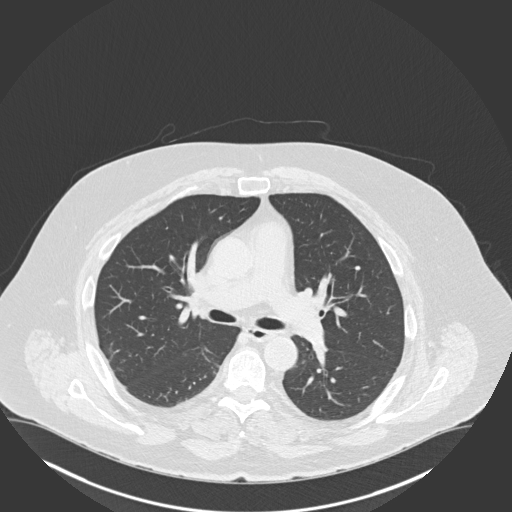
[im 124/179  lung]
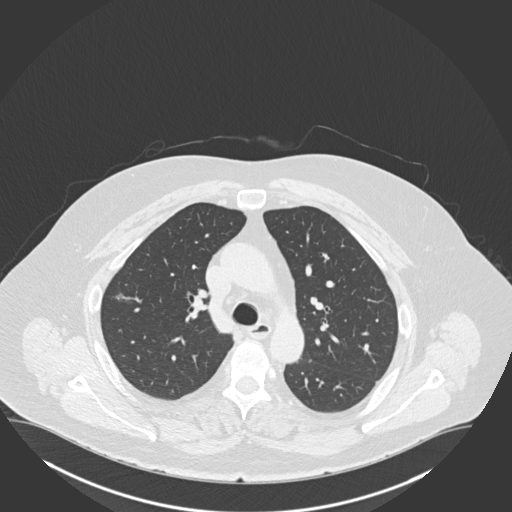
[im 137/179  mediastinal]
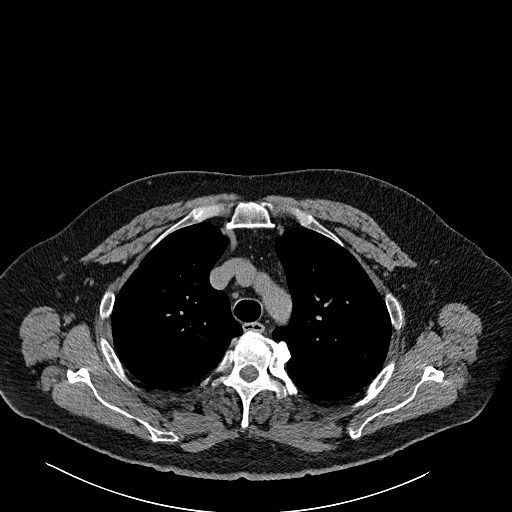
[im 137/179  lung]
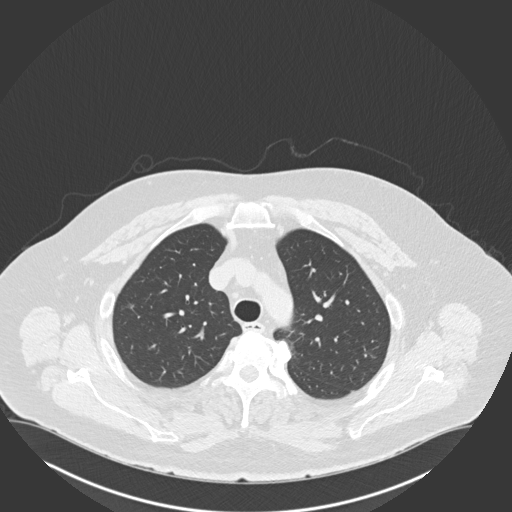
[im 151/179  lung]
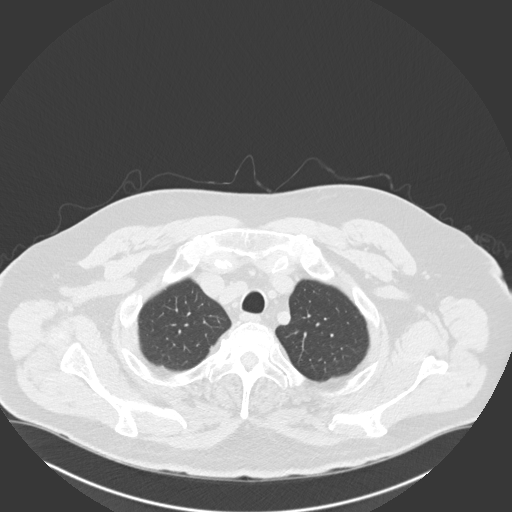
[im 165/179  lung]
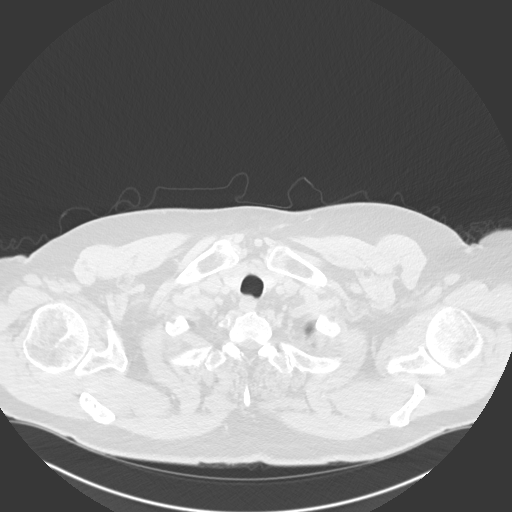

[Series 5: coronal · coronal · 0.67mm/px · 3 of 151 slices shown]
[im 31/151  lung]
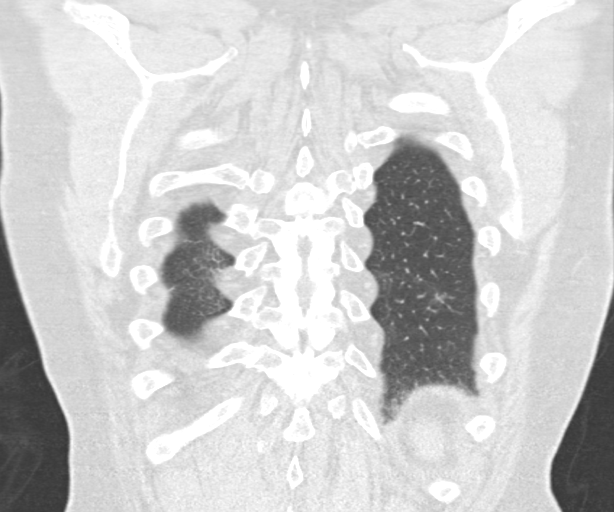
[im 61/151  lung]
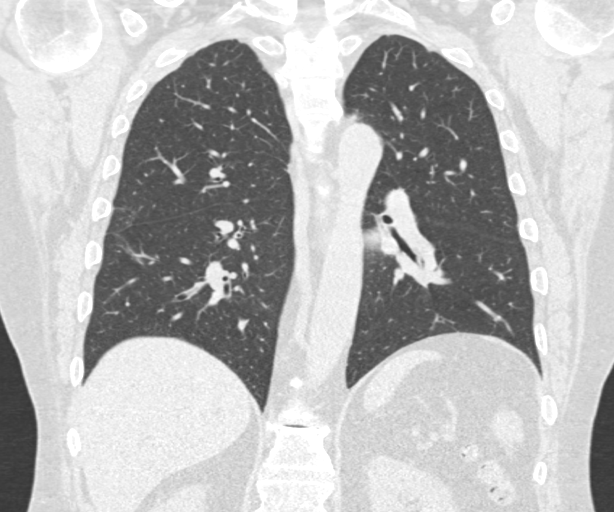
[im 91/151  lung]
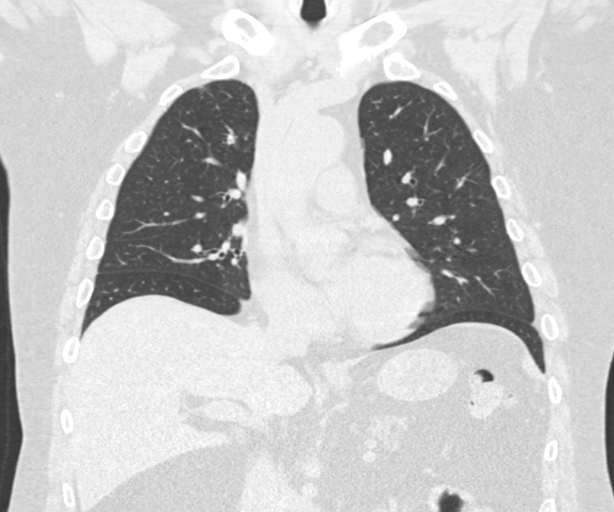

[14 of 36 positions shown; findings below may reference images not displayed]

FINDINGS: Cardiovascular: The thoracic aorta is normal. Central pulmonary
arteries are normal. Calcified atherosclerosis is seen in the
proximal LAD. Heart size is normal.

Mediastinum/Nodes: No pleural or pericardial effusions. The thyroid
is normal. The esophagus is normal. Chest wall is normal. No
adenopathy.

Lungs/Pleura: Central airways are normal. No pneumothorax. Scattered
subsegmental atelectasis on the left. Ground-glass posteriorly in
the left base on series 4, image 104 appears to represent focal
atelectasis on coronal imaging. No suspicious nodules, masses, or
infiltrates on the left. Scattered subsegmental atelectasis on the
right as well. Focal ground-glass in the periphery of the right lung
on coronal image 79 and axial image 53 could represent atelectasis
or focal infiltrate/infection. A nodule in the anterior right lung
on series 4, image 91 and coronal image 120 measures 7 by 5 by 6 mm
with a mean diameter of 6 mm. Focal ground-glass posteriorly in the
right base on series 4, image 100 could represent focal atelectasis
or developing infiltrate. Other smaller regions of focal
ground-glass are identified, most of which appear to represent
atelectasis. No masses.

Upper Abdomen: No acute abnormality.

Musculoskeletal: The rounded high density projected over the upper
thoracic vertebral body on the lateral view of today's x-ray
correlates with bony bridging between a left medial rib in the
adjacent vertebral body as seen on axial image 43.
IMPRESSION: 1. The rounded region of increased attenuation projected over an
upper thoracic vertebral body on today's x-ray represents bony
bridging between a vertebral body and its adjacent rib.
2. There is a 6 mm nodule in the right lung as above. Non-contrast
chest CT at 6 months is recommended. If the nodule is stable at time
of repeat CT, then future CT at 18-24 months (from today's scan) is
considered optional for low-risk patients, but is recommended for
high-risk patients. This recommendation follows the consensus
statement: Guidelines for Management of Incidental Pulmonary Nodules
Detected on CT Images: From the [HOSPITAL] 8361; Radiology
8361; [DATE].
3. Scattered regions of ground-glass in the lungs, right greater
than left. Most appear to represent atelectasis. Others could
represent atelectasis or an inflammatory/infectious process.
Recommend attention on the follow-up CT scan.
4. Calcified atherosclerosis in the proximal LAD.

## 2022-07-15 ENCOUNTER — Ambulatory Visit (INDEPENDENT_AMBULATORY_CARE_PROVIDER_SITE_OTHER): Payer: 59

## 2022-07-15 ENCOUNTER — Telehealth: Payer: Self-pay | Admitting: Family Medicine

## 2022-07-15 ENCOUNTER — Encounter: Payer: Self-pay | Admitting: Sports Medicine

## 2022-07-15 ENCOUNTER — Ambulatory Visit: Payer: 59 | Admitting: Sports Medicine

## 2022-07-15 DIAGNOSIS — M1712 Unilateral primary osteoarthritis, left knee: Secondary | ICD-10-CM

## 2022-07-15 DIAGNOSIS — M170 Bilateral primary osteoarthritis of knee: Secondary | ICD-10-CM | POA: Insufficient documentation

## 2022-07-15 DIAGNOSIS — Z09 Encounter for follow-up examination after completed treatment for conditions other than malignant neoplasm: Secondary | ICD-10-CM | POA: Diagnosis not present

## 2022-07-15 MED ORDER — MELOXICAM 15 MG PO TABS: ORAL_TABLET | ORAL | 3 refills | Status: DC

## 2022-07-15 NOTE — Telephone Encounter (Signed)
Patient requested paperwork provided at appointment be uploaded to [place], as paperwork physical copies were laid down on accident. Timothy Carr

## 2022-07-15 NOTE — Assessment & Plan Note (Signed)
This is a very pleasant 61 year old male, has a long history of bilateral knee pain left worse than right, pain is predominantly medial joint line and medial femoral condyle. No trauma, no mechanical symptoms. He is interested in aggressive treatment today so we performed an injection, adding meloxicam, x-rays, home physical therapy, continue his journey with weight loss, return to see me in 6 weeks.

## 2022-07-15 NOTE — Progress Notes (Signed)
    Procedures performed today:    Procedure: Real-time Ultrasound Guided injection of the left knee Device: Samsung HS60  Verbal informed consent obtained.  Time-out conducted.  Noted no overlying erythema, induration, or other signs of local infection.  Skin prepped in a sterile fashion.  Local anesthesia: Topical Ethyl chloride.  With sterile technique and under real time ultrasound guidance: Moderate effusion noted, 1 cc Kenalog 40, 2 cc lidocaine, 2 cc bupivacaine injected easily Completed without difficulty  Advised to call if fevers/chills, erythema, induration, drainage, or persistent bleeding.  Images permanently stored and available for review in PACS.  Impression: Technically successful ultrasound guided injection.  Independent interpretation of notes and tests performed by another provider:   None.  Brief History, Exam, Impression, and Recommendations:    Primary osteoarthritis of left knee This is a very pleasant 61 year old male, has a long history of bilateral knee pain left worse than right, pain is predominantly medial joint line and medial femoral condyle. No trauma, no mechanical symptoms. He is interested in aggressive treatment today so we performed an injection, adding meloxicam, x-rays, home physical therapy, continue his journey with weight loss, return to see me in 6 weeks.    ____________________________________________ Gwen Her. Dianah Field, M.D., ABFM., CAQSM., AME. Primary Care and Sports Medicine [facility]  Adjunct Professor of [employer] of Medicine  Risk manager

## 2022-08-26 ENCOUNTER — Ambulatory Visit: Payer: Self-pay | Admitting: Sports Medicine

## 2022-09-11 ENCOUNTER — Ambulatory Visit: Payer: 59 | Admitting: Sports Medicine

## 2022-09-11 ENCOUNTER — Telehealth: Payer: Self-pay | Admitting: Sports Medicine

## 2022-09-11 DIAGNOSIS — M1712 Unilateral primary osteoarthritis, left knee: Secondary | ICD-10-CM | POA: Diagnosis not present

## 2022-09-11 MED ORDER — TRAMADOL HCL 50 MG PO TABS: 50.0000 mg | ORAL_TABLET | Freq: Three times a day (TID) | ORAL | 0 refills | Status: DC | PRN

## 2022-09-11 NOTE — Telephone Encounter (Signed)
PA information submitted via [email or web address] for Orthovisc Paperwork has been printed and given to Dr. T for signatures. Once obtained, information will be faxed to [employer] at [phone]  

## 2022-09-11 NOTE — Assessment & Plan Note (Signed)
Less than 61 year old male, bilateral knee osteoarthritis left worse than right, has failed conservative treatment so far including NSAIDs, home physical therapy greater than 6 weeks, steroid injection. We will going get Orthovisc approval, tramadol for breakthrough pain in the meantime, return to see me to start viscosupplementation when approved.

## 2022-09-11 NOTE — Progress Notes (Signed)
    Procedures performed today:    None.  Independent interpretation of notes and tests performed by another provider:   None.  Brief History, Exam, Impression, and Recommendations:    Primary osteoarthritis of left knee Less than 61 year old male, bilateral knee osteoarthritis left worse than right, has failed conservative treatment so far including NSAIDs, home physical therapy greater than 6 weeks, steroid injection. We will going get Orthovisc approval, tramadol for breakthrough pain in the meantime, return to see me to start viscosupplementation when approved.    ____________________________________________ Ihor Austin. Benjamin Stain, M.D., ABFM., CAQSM., AME. Primary Care and Sports Medicine [facility]  Adjunct Professor of Family Medicine  [place] [employer] of [employer]  Restaurant manager, fast food

## 2022-09-11 NOTE — Telephone Encounter (Signed)
[employer] approval please, bilateral x-ray confirmed osteoarthritis, failed everything including steroid injections, NSAIDs and greater than 6 weeks of home physical therapy.

## 2022-09-17 NOTE — Telephone Encounter (Signed)
Benefits Investigation Details received from [employer] Injection: Euflexxa  Medical: Deductible does not apply. Once the OOP has been met patient will be covered at 100% Pharmacy: the product is not covered by pharmacy plan May fill through: [facility] Copay/Coinsurance: $50 Product Copay: 0% Administration Coinsurance: 0 Administration Copay: $50 Deductible: $5000 (met: $37) Out of Pocket Max: $15000 (met: $37)

## 2022-09-19 NOTE — Telephone Encounter (Signed)
Patient notified of cost and wants to do the injections can you please get them ordered.

## 2022-09-19 NOTE — Telephone Encounter (Signed)
What is the amount required to order (1 or 2 boxes)?

## 2022-09-19 NOTE — Telephone Encounter (Signed)
Looks like 2 boxes since we are doing both knees (6 total syringes of Euflexxa)

## 2022-09-22 NOTE — Telephone Encounter (Signed)
Task completed. An order has been placed for 2 boxes for Euflexxa. Order #[id number].

## 2022-09-29 NOTE — Telephone Encounter (Signed)
Per [employer] - product is currently on backorder. No anticipated date provided as to when product will be delivered. Unable to contact the supplier directly for an updated due to the observation of [place] day.

## 2022-10-16 ENCOUNTER — Telehealth: Payer: Self-pay | Admitting: Sports Medicine

## 2022-10-16 NOTE — Telephone Encounter (Signed)
Patient called in regards to gel injections he is asking if he could get a different injection due to being on back order. So he can be see sooner

## 2022-10-17 NOTE — Telephone Encounter (Signed)
I was informed of the following update regarding Euflexxa product:   This product is on backorder and will not be available until possibly late February. There are no Euflexxa available at any of the [employer] or [employer] distributing warehouses.    I called the manufacturer and they have the same info. They are redoing some things on the product and it is now pending for the FDA to approve those changes. Estimated date of possible delivery for Euflexxa is late February. 

## 2022-10-27 ENCOUNTER — Ambulatory Visit: Payer: 59 | Admitting: Sports Medicine

## 2022-10-27 ENCOUNTER — Ambulatory Visit (INDEPENDENT_AMBULATORY_CARE_PROVIDER_SITE_OTHER): Payer: 59

## 2022-10-27 DIAGNOSIS — M170 Bilateral primary osteoarthritis of knee: Secondary | ICD-10-CM

## 2022-10-27 DIAGNOSIS — M1712 Unilateral primary osteoarthritis, left knee: Secondary | ICD-10-CM

## 2022-10-27 MED ORDER — TRAMADOL HCL 50 MG PO TABS: 50.0000 mg | ORAL_TABLET | Freq: Three times a day (TID) | ORAL | 0 refills | Status: DC | PRN

## 2022-10-27 MED ORDER — EUFLEXXA 20 MG/2ML IX SOSY: 2.0000 mL | PREFILLED_SYRINGE | Freq: Once | INTRA_ARTICULAR | 0 refills | Status: DC

## 2022-10-27 NOTE — Assessment & Plan Note (Signed)
Very pleasant 62 year old male, bilateral knee osteoarthritis, we started Euflexxa No. 1 today, return in 1 week for Euflexxa No. 2 of 3 both knees. Adding some tramadol as well for pain relief in the meantime.

## 2022-10-27 NOTE — Addendum Note (Signed)
Addended by: Tarri Glenn A on: 10/27/2022 04:56 PM   Modules accepted: Orders

## 2022-10-27 NOTE — Progress Notes (Signed)
    Procedures performed today:    Procedure: Real-time Ultrasound Guided injection of the left knee Device: Samsung HS60  Verbal informed consent obtained.  Time-out conducted.  Noted no overlying erythema, induration, or other signs of local infection.  Skin prepped in a sterile fashion.  Local anesthesia: Topical Ethyl chloride.  With sterile technique and under real time ultrasound guidance: Trace effusion noted, 1 syringe of Euflexxa injected easily into the suprapatellar recess through a 22-gauge needle. Completed without difficulty  Advised to call if fevers/chills, erythema, induration, drainage, or persistent bleeding.  Images permanently stored and available for review in PACS.  Impression: Technically successful ultrasound guided injection.  Procedure: Real-time Ultrasound Guided injection of the right knee Device: Samsung HS60  Verbal informed consent obtained.  Time-out conducted.  Noted no overlying erythema, induration, or other signs of local infection.  Skin prepped in a sterile fashion.  Local anesthesia: Topical Ethyl chloride.  With sterile technique and under real time ultrasound guidance: Trace effusion noted, 1 syringe of Euflexxa injected easily into the suprapatellar recess through a 22-gauge needle. Completed without difficulty  Advised to call if fevers/chills, erythema, induration, drainage, or persistent bleeding.  Images permanently stored and available for review in PACS.  Impression: Technically successful ultrasound guided injection.  Independent interpretation of notes and tests performed by another provider:   None.  Brief History, Exam, Impression, and Recommendations:    Primary osteoarthritis of both knees Very pleasant 62 year old male, bilateral knee osteoarthritis, we started Euflexxa No. 1 today, return in 1 week for Euflexxa No. 2 of 3 both knees. Adding some tramadol as well for pain relief in the  meantime.    ____________________________________________ Gwen Her. Dianah Field, M.D., ABFM., CAQSM., AME. Primary Care and Sports Medicine [facility]  Adjunct Professor of [place] [facility] of Medicine  Risk manager

## 2022-10-29 ENCOUNTER — Telehealth: Payer: Self-pay | Admitting: Sports Medicine

## 2022-10-29 NOTE — Telephone Encounter (Signed)
Patient called and asked if it would be ok to use a heating pad on his left knee after getting an gel injection he didn't want to mess anything up

## 2022-10-30 NOTE — Telephone Encounter (Signed)
If having pain/swelling cryotherapy/cooling would be a better bet but he will not mess up anything with a heating pad.

## 2022-11-03 ENCOUNTER — Ambulatory Visit: Payer: 59 | Admitting: Sports Medicine

## 2022-11-03 ENCOUNTER — Ambulatory Visit (INDEPENDENT_AMBULATORY_CARE_PROVIDER_SITE_OTHER): Payer: 59

## 2022-11-03 DIAGNOSIS — M170 Bilateral primary osteoarthritis of knee: Secondary | ICD-10-CM

## 2022-11-03 NOTE — Assessment & Plan Note (Addendum)
Euflexxa injection #2 of 3 today, return in 1 week for #3 of 3. He does have tramadol and plans to go up to 100 mg twice daily.

## 2022-11-03 NOTE — Progress Notes (Addendum)
    Procedures performed today:    Procedure: Real-time Ultrasound Guided injection of the left knee Device: Samsung HS60  Verbal informed consent obtained.  Time-out conducted.  Noted no overlying erythema, induration, or other signs of local infection.  Skin prepped in a sterile fashion.  Local anesthesia: Topical Ethyl chloride.  With sterile technique and under real time ultrasound guidance: Trace effusion noted, 1 syringe of Euflexxa injected easily into the suprapatellar recess through a 22-gauge needle. Completed without difficulty  Advised to call if fevers/chills, erythema, induration, drainage, or persistent bleeding.  Images permanently stored and available for review in PACS.  Impression: Technically successful ultrasound guided injection.   Procedure: Real-time Ultrasound Guided injection of the right knee Device: Samsung HS60  Verbal informed consent obtained.  Time-out conducted.  Noted no overlying erythema, induration, or other signs of local infection.  Skin prepped in a sterile fashion.  Local anesthesia: Topical Ethyl chloride.  With sterile technique and under real time ultrasound guidance: Trace effusion noted, 1 syringe of Euflexxa injected easily into the suprapatellar recess through a 22-gauge needle. Completed without difficulty  Advised to call if fevers/chills, erythema, induration, drainage, or persistent bleeding.  Images permanently stored and available for review in PACS.  Impression: Technically successful ultrasound guided injection.  Independent interpretation of notes and tests performed by another provider:   None.  Brief History, Exam, Impression, and Recommendations:    Primary osteoarthritis of both knees Euflexxa injection #2 of 3 today, return in 1 week for #3 of 3. He does have tramadol and plans to go up to 100 mg twice daily.    ____________________________________________ Gwen Her. Dianah Field, M.D., ABFM., CAQSM.,  AME. Primary Care and Sports Medicine [facility]  Adjunct Professor of [place] [employer] [facility] of Medicine  Risk manager

## 2022-11-10 ENCOUNTER — Ambulatory Visit: Payer: 59 | Admitting: Sports Medicine

## 2022-11-10 ENCOUNTER — Ambulatory Visit (INDEPENDENT_AMBULATORY_CARE_PROVIDER_SITE_OTHER): Payer: 59

## 2022-11-10 DIAGNOSIS — M170 Bilateral primary osteoarthritis of knee: Secondary | ICD-10-CM

## 2022-11-10 NOTE — Assessment & Plan Note (Signed)
Euflexxa 3 of 3 both knees, return as needed.

## 2022-11-10 NOTE — Progress Notes (Signed)
    Procedures performed today:    Procedure: Real-time Ultrasound Guided injection of the left knee Device: Samsung HS60  Verbal informed consent obtained.  Time-out conducted.  Noted no overlying erythema, induration, or other signs of local infection.  Skin prepped in a sterile fashion.  Local anesthesia: Topical Ethyl chloride.  With sterile technique and under real time ultrasound guidance: Trace effusion noted, 1 syringe of Euflexxa injected easily into the suprapatellar recess through a 22-gauge needle. Completed without difficulty  Advised to call if fevers/chills, erythema, induration, drainage, or persistent bleeding.  Images permanently stored and available for review in PACS.  Impression: Technically successful ultrasound guided injection.   Procedure: Real-time Ultrasound Guided injection of the right knee Device: Samsung HS60  Verbal informed consent obtained.  Time-out conducted.  Noted no overlying erythema, induration, or other signs of local infection.  Skin prepped in a sterile fashion.  Local anesthesia: Topical Ethyl chloride.  With sterile technique and under real time ultrasound guidance: Trace effusion noted, 1 syringe of Euflexxa injected easily into the suprapatellar recess through a 22-gauge needle. Completed without difficulty  Advised to call if fevers/chills, erythema, induration, drainage, or persistent bleeding.  Images permanently stored and available for review in PACS.  Impression: Technically successful ultrasound guided injection.  Independent interpretation of notes and tests performed by another provider:   None.  Brief History, Exam, Impression, and Recommendations:    Primary osteoarthritis of both knees Euflexxa 3 of 3 both knees, return as needed.    ____________________________________________ Gwen Her. Dianah Field, M.D., ABFM., CAQSM., AME. Primary Care and Sports Medicine [facility]  Adjunct Professor  of [place] [employer] [facility] of Medicine  Risk manager

## 2023-07-10 ENCOUNTER — Ambulatory Visit: Payer: 59 | Admitting: Family Medicine

## 2023-07-10 ENCOUNTER — Encounter: Payer: Self-pay | Admitting: Family Medicine

## 2023-07-10 ENCOUNTER — Ambulatory Visit: Payer: 59

## 2023-07-10 VITALS — BP 131/90 | HR 94 | Temp 98.6°F | Ht 76.0 in | Wt 299.0 lb

## 2023-07-10 DIAGNOSIS — R0600 Dyspnea, unspecified: Secondary | ICD-10-CM

## 2023-07-10 DIAGNOSIS — R052 Subacute cough: Secondary | ICD-10-CM

## 2023-07-10 DIAGNOSIS — R059 Cough, unspecified: Secondary | ICD-10-CM | POA: Diagnosis not present

## 2023-07-10 MED ORDER — PREDNISONE 50 MG PO TABS: ORAL_TABLET | ORAL | 0 refills | Status: DC

## 2023-07-10 MED ORDER — ALBUTEROL SULFATE HFA 108 (90 BASE) MCG/ACT IN AERS: 2.0000 | INHALATION_SPRAY | Freq: Four times a day (QID) | RESPIRATORY_TRACT | 0 refills | Status: DC | PRN

## 2023-07-10 MED ORDER — BENZONATATE 200 MG PO CAPS: 200.0000 mg | ORAL_CAPSULE | Freq: Two times a day (BID) | ORAL | 0 refills | Status: DC | PRN

## 2023-07-10 MED ORDER — DOXYCYCLINE HYCLATE 100 MG PO TABS: 100.0000 mg | ORAL_TABLET | Freq: Two times a day (BID) | ORAL | 0 refills | Status: DC

## 2023-07-10 NOTE — Progress Notes (Unsigned)
Timothy Carr - 62 y.o. male MRN [id number]  Date of birth: [birth year]  Subjective Chief Complaint  Patient presents with   Cough    1 month    HPI Timothy Carr is a 62 y.o. male here today with compliant of cough and chest congestion.  He has had productive cough for nearly 1 month.  Phlegm is thick and yellow in appearance, typically worse in the morning. He has not had fever or chills.  He has had dyspnea associated with his cough  Reports that there was some water damage at him office recently and they are testing for mold.    ROS:  A comprehensive ROS was completed and negative except as noted per HPI  No Known Allergies  Past Medical History:  Diagnosis Date   COVID-19 10/2020    No past surgical history on file.  Social History   Socioeconomic History   Marital status: Married    Spouse name: Not on file   Number of children: Not on file   Years of education: Not on file   Highest education level: Not on file  Occupational History   Not on file  Tobacco Use   Smoking status: Never   Smokeless tobacco: Never  Vaping Use   Vaping status: Never Used  Substance and Sexual Activity   Alcohol use: Yes    Comment: socially   Drug use: Never   Sexual activity: Yes  Other Topics Concern   Not on file  Social History Narrative   Not on file   Social Determinants of Health   Financial Resource Strain: Not on file  Food Insecurity: Not on file  Transportation Needs: Not on file  Physical Activity: Not on file  Stress: Not on file  Social Connections: Unknown (01/25/2022)   Received from [facility]   Social Network    Social Network: Not on file    Family History  Problem Relation Age of Onset   Healthy Mother    Heart failure Father    Cancer Maternal Grandmother    Stroke Maternal Grandmother     Health Maintenance  Topic Date Due   HIV Screening  Never done   Hepatitis C Screening  Never done   DTaP/Tdap/Td (1 - Tdap) Never  done   Colonoscopy  Never done   Zoster Vaccines- Shingrix (1 of 2) Never done   INFLUENZA VACCINE  Never done   COVID-19 Vaccine (3 - 2023-24 season) 05/17/2023   HPV VACCINES  Aged Out     ----------------------------------------------------------------------------------------------------------------------------------------------------------------------------------------------------------------- Physical Exam BP (!) 142/91 (BP Location: Left Arm, Patient Position: Sitting, Cuff Size: Large)   Pulse 98   Temp 98.6 F (37 C) (Oral)   Ht 6\' 4"  (1.93 m)   Wt 299 lb (135.6 kg)   SpO2 100%   BMI 36.40 kg/m   Physical Exam Constitutional:      Appearance: Normal appearance.  Eyes:     General: No scleral icterus. Cardiovascular:     Rate and Rhythm: Normal rate and regular rhythm.  Pulmonary:     Effort: Pulmonary effort is normal.     Breath sounds: Normal breath sounds.  Neurological:     Mental Status: He is alert.  Psychiatric:        Mood and Affect: Mood normal.        Behavior: Behavior normal.     ------------------------------------------------------------------------------------------------------------------------------------------------------------------------------------------------------------------- Assessment and Plan  No problem-specific Assessment & Plan notes found for this encounter.  No orders of the defined types were placed in this encounter.   No follow-ups on file.    This visit occurred during the SARS-CoV-2 public health emergency.  Safety protocols were in place, including screening questions prior to the visit, additional usage of staff PPE, and extensive cleaning of exam room while observing appropriate contact time as indicated for disinfecting solutions.

## 2023-07-12 ENCOUNTER — Encounter: Payer: Self-pay | Admitting: Family Medicine

## 2023-07-12 DIAGNOSIS — R052 Subacute cough: Secondary | ICD-10-CM | POA: Insufficient documentation

## 2023-07-12 NOTE — Assessment & Plan Note (Signed)
He has had prolonged cough with some dyspnea.  A course of doxycycline, prednisone burst.  Tessalon Perles and albuterol as needed.  Chest x-ray ordered.

## 2023-08-06 ENCOUNTER — Ambulatory Visit: Payer: 59 | Admitting: Family Medicine

## 2023-08-06 ENCOUNTER — Encounter: Payer: Self-pay | Admitting: Family Medicine

## 2023-08-06 VITALS — BP 143/90 | HR 88 | Ht 76.0 in | Wt 304.0 lb

## 2023-08-06 DIAGNOSIS — R052 Subacute cough: Secondary | ICD-10-CM | POA: Diagnosis not present

## 2023-08-06 MED ORDER — HYDROCODONE BIT-HOMATROP MBR 5-1.5 MG/5ML PO SOLN: 5.0000 mL | Freq: Three times a day (TID) | ORAL | 0 refills | Status: DC | PRN

## 2023-08-06 MED ORDER — PREDNISONE 10 MG (48) PO TBPK: ORAL_TABLET | ORAL | 0 refills | Status: DC

## 2023-08-06 MED ORDER — FLUTICASONE-SALMETEROL 100-50 MCG/ACT IN AEPB: 1.0000 | INHALATION_SPRAY | Freq: Two times a day (BID) | RESPIRATORY_TRACT | 1 refills | Status: DC

## 2023-08-06 NOTE — Progress Notes (Signed)
Timothy Carr - 62 y.o. male MRN [id number]  Date of birth: 1960/11/08  Subjective Chief Complaint  Patient presents with   Cough   URI    HPI Timothy Carr is a  62 y.o. male here today with continued cough.  He has had cough for about 2 month.  Treated with doxycyline and prednisone last month.  Albuterol as needed was added as well.  CXR unremarkable.  Denies fever or chills. He has not had dyspnea.  He does feel that the prednisone helped previously.   ROS:  A comprehensive ROS was completed and negative except as noted per HPI  No Known Allergies  Past Medical History:  Diagnosis Date   COVID-19 10/2020    History reviewed. No pertinent surgical history.  Social History   Socioeconomic History   Marital status: Married    Spouse name: Not on file   Number of children: Not on file   Years of education: Not on file   Highest education level: Not on file  Occupational History   Not on file  Tobacco Use   Smoking status: Never   Smokeless tobacco: Never  Vaping Use   Vaping status: Never Used  Substance and Sexual Activity   Alcohol use: Yes    Comment: socially   Drug use: Never   Sexual activity: Yes  Other Topics Concern   Not on file  Social History Narrative   Not on file   Social Determinants of Health   Financial Resource Strain: Not on file  Food Insecurity: Not on file  Transportation Needs: Not on file  Physical Activity: Not on file  Stress: Not on file  Social Connections: Unknown (01/25/2022)   [facility]   Social Network    Social Network: Not on file    Family History  Problem Relation Age of Onset   Healthy Mother    Heart failure Father    Cancer Maternal Grandmother    Stroke Maternal Grandmother     Health Maintenance  Topic Date Due   HIV Screening  Never done   Hepatitis C Screening  Never done   DTaP/Tdap/Td (1 - Tdap) Never done   Colonoscopy  Never done   Zoster Vaccines- Shingrix  (1 of 2) Never done   INFLUENZA VACCINE  Never done   COVID-19 Vaccine (3 - 2023-24 season) 05/17/2023   HPV VACCINES  Aged Out     ----------------------------------------------------------------------------------------------------------------------------------------------------------------------------------------------------------------- Physical Exam BP (!) 143/90 (BP Location: Left Arm, Patient Position: Sitting, Cuff Size: Large)   Pulse 88   Ht 6\' 4"  (1.93 m)   Wt (!) 304 lb (137.9 kg)   SpO2 98%   BMI 37.00 kg/m   Physical Exam Constitutional:      Appearance: Normal appearance.  Cardiovascular:     Rate and Rhythm: Normal rate and regular rhythm.  Pulmonary:     Effort: Pulmonary effort is normal.     Breath sounds: Normal breath sounds.  Neurological:     General: No focal deficit present.     Mental Status: He is alert.  Psychiatric:        Mood and Affect: Mood normal.        Behavior: Behavior normal.     ------------------------------------------------------------------------------------------------------------------------------------------------------------------------------------------------------------------- Assessment and Plan  Subacute cough He did seem to have improvement with steroids.  Adding longer taper of prednisone and will add wixela inhaler.  Recommend addition of flonase daily.  If not improving with this will  plan to refer to pulmonology.    Meds ordered this encounter  Medications   HYDROcodone bit-homatropine (HYCODAN) 5-1.5 MG/5ML syrup    Sig: Take 5 mLs by mouth every 8 (eight) hours as needed for cough.    Dispense:  120 mL    Refill:  0   fluticasone-salmeterol (WIXELA INHUB) 100-50 MCG/ACT AEPB    Sig: Inhale 1 puff into the lungs 2 (two) times daily.    Dispense:  60 each    Refill:  1   predniSONE (STERAPRED UNI-PAK 48 TAB) 10 MG (48) TBPK tablet    Sig: Taper as directed on packaging (12 day taper)    Dispense:  48 tablet     Refill:  0    No follow-ups on file.    This visit occurred during the SARS-CoV-2 public health emergency.  Safety protocols were in place, including screening questions prior to the visit, additional usage of staff PPE, and extensive cleaning of exam room while observing appropriate contact time as indicated for disinfecting solutions.

## 2023-08-06 NOTE — Assessment & Plan Note (Signed)
He did seem to have improvement with steroids.  Adding longer taper of prednisone and will add wixela inhaler.  Recommend addition of flonase daily.  If not improving with this will plan to refer to pulmonology.

## 2023-08-06 NOTE — Patient Instructions (Signed)
Start wixela inhaler twice per day Rinse mouth with glass of water after each use.  Albuterol as needed.   Add flonase daily.

## 2023-08-19 ENCOUNTER — Encounter: Payer: Self-pay | Admitting: Family Medicine

## 2023-08-19 ENCOUNTER — Ambulatory Visit: Payer: 59 | Admitting: Family Medicine

## 2023-08-19 VITALS — BP 129/80 | HR 98 | Ht 76.0 in | Wt 299.0 lb

## 2023-08-19 DIAGNOSIS — J0100 Acute maxillary sinusitis, unspecified: Secondary | ICD-10-CM | POA: Diagnosis not present

## 2023-08-19 MED ORDER — CEFDINIR 300 MG PO CAPS: 300.0000 mg | ORAL_CAPSULE | Freq: Two times a day (BID) | ORAL | 0 refills | Status: DC

## 2023-08-19 NOTE — Patient Instructions (Signed)

## 2023-08-19 NOTE — Assessment & Plan Note (Signed)
Recommend continued supportive and symptomatic care. Adding course of cefdinir.  Precautions and red flags reviewed.

## 2023-08-19 NOTE — Progress Notes (Signed)
Timothy Carr - 62 y.o. male MRN [id number]  Date of birth: 08/24/1961  Subjective Chief Complaint  Patient presents with   Sinusitis    HPI [name] is a 62 y.o. male here today with complaint of fatigue, sore throat, sinus congestion and sinus pain.  His respiratory symptoms have improved at this point and these symptoms began about 3-4 days ago.  Mucus is thick and yellow with some blood streaked in.  He has not had fever or chills.  He is drinking plenty of fluids.   ROS:  A comprehensive ROS was completed and negative except as noted per HPI   Past Medical History:  Diagnosis Date   COVID-19 10/2020    History reviewed. No pertinent surgical history.  Social History   Socioeconomic History   Marital status: Married    Spouse name: Not on file   Number of children: Not on file   Years of education: Not on file   Highest education level: Not on file  Occupational History   Not on file  Tobacco Use   Smoking status: Never   Smokeless tobacco: Never  Vaping Use   Vaping status: Never Used  Substance and Sexual Activity   Alcohol use: Yes    Comment: socially   Drug use: Never   Sexual activity: Yes  Other Topics Concern   Not on file  Social History Narrative   Not on file   Social Determinants of Health   Financial Resource Strain: Not on file  Food Insecurity: Not on file  Transportation Needs: Not on file  Physical Activity: Not on file  Stress: Not on file  Social Connections: Unknown (01/25/2022)   Received from [facility], [facility]   Social Network    Social Network: Not on file    Family History  Problem Relation Age of Onset   Healthy Mother    Heart failure Father    Cancer Maternal Grandmother    Stroke Maternal Grandmother     Health Maintenance  Topic Date Due   HIV Screening  Never done   Hepatitis C Screening  Never done   DTaP/Tdap/Td (1 - Tdap) Never done   Colonoscopy  Never done   Zoster Vaccines- Shingrix (1  of 2) Never done   INFLUENZA VACCINE  Never done   COVID-19 Vaccine (3 - 2023-24 season) 05/17/2023   HPV VACCINES  Aged Out     ----------------------------------------------------------------------------------------------------------------------------------------------------------------------------------------------------------------- Physical Exam BP (!) 142/96 (BP Location: Left Arm, Patient Position: Sitting, Cuff Size: Large)   Pulse 98   Ht 6\' 4"  (1.93 m)   Wt 299 lb (135.6 kg)   SpO2 98%   BMI 36.40 kg/m   Physical Exam Constitutional:      Appearance: Normal appearance.  HENT:     Right Ear: Tympanic membrane normal.     Left Ear: Tympanic membrane normal.     Nose:     Comments: B/L maxillary sinus tenderness.  Cardiovascular:     Rate and Rhythm: Normal rate and regular rhythm.  Pulmonary:     Effort: Pulmonary effort is normal.     Breath sounds: Normal breath sounds.  Neurological:     Mental Status: He is alert.  Psychiatric:        Mood and Affect: Mood normal.        Behavior: Behavior normal.     ------------------------------------------------------------------------------------------------------------------------------------------------------------------------------------------------------------------- Assessment and Plan  Acute maxillary sinusitis Recommend continued supportive and symptomatic care. Adding course of cefdinir.  Precautions and red flags reviewed.    Meds ordered this encounter  Medications   cefdinir (OMNICEF) 300 MG capsule    Sig: Take 1 capsule (300 mg total) by mouth 2 (two) times daily for 7 days.    Dispense:  14 capsule    Refill:  0    No follow-ups on file.    This visit occurred during the SARS-CoV-2 public health emergency.  Safety protocols were in place, including screening questions prior to the visit, additional usage of staff PPE, and extensive cleaning of exam room while observing appropriate contact time as  indicated for disinfecting solutions.

## 2023-08-26 ENCOUNTER — Telehealth: Payer: Self-pay | Admitting: Family Medicine

## 2023-08-26 ENCOUNTER — Other Ambulatory Visit: Payer: Self-pay | Admitting: Family Medicine

## 2023-08-26 MED ORDER — CEFDINIR 300 MG PO CAPS: 300.0000 mg | ORAL_CAPSULE | Freq: Two times a day (BID) | ORAL | 0 refills | Status: AC

## 2023-08-26 NOTE — Telephone Encounter (Signed)
[employer] Agent Timothy Carr called about patient wanting to get another round of cefdinir (OMNICEF) 300 MG capsule, patient states that it is helping but he needs it longer please advise, thanks.

## 2023-08-26 NOTE — Telephone Encounter (Signed)
Patient is needing another round of antibiotics there helping but he is out he would like a call back regarding this

## 2023-10-06 ENCOUNTER — Encounter: Payer: Self-pay | Admitting: Medical-Surgical

## 2023-10-06 ENCOUNTER — Ambulatory Visit: Payer: Self-pay | Admitting: Family Medicine

## 2023-10-06 ENCOUNTER — Ambulatory Visit: Payer: 59 | Admitting: Medical-Surgical

## 2023-10-06 VITALS — BP 137/88 | HR 119 | Resp 20 | Ht 76.0 in | Wt 309.5 lb

## 2023-10-06 DIAGNOSIS — U071 COVID-19: Secondary | ICD-10-CM | POA: Insufficient documentation

## 2023-10-06 LAB — POC COVID19 BINAXNOW: SARS Coronavirus 2 Ag: POSITIVE — AB

## 2023-10-06 LAB — POCT INFLUENZA A/B
Influenza A, POC: NEGATIVE
Influenza B, POC: NEGATIVE

## 2023-10-06 LAB — POCT RAPID STREP A (OFFICE): Rapid Strep A Screen: NEGATIVE

## 2023-10-06 MED ORDER — DM-GUAIFENESIN ER 30-600 MG PO TB12: 1.0000 | ORAL_TABLET | Freq: Two times a day (BID) | ORAL | 0 refills | Status: DC

## 2023-10-06 MED ORDER — ALBUTEROL SULFATE HFA 108 (90 BASE) MCG/ACT IN AERS: 2.0000 | INHALATION_SPRAY | Freq: Four times a day (QID) | RESPIRATORY_TRACT | 0 refills | Status: DC | PRN

## 2023-10-06 MED ORDER — HYDROCODONE BIT-HOMATROP MBR 5-1.5 MG/5ML PO SOLN: 5.0000 mL | Freq: Four times a day (QID) | ORAL | 0 refills | Status: DC | PRN

## 2023-10-06 MED ORDER — MOLNUPIRAVIR EUA 200MG CAPSULE: 4.0000 | ORAL_CAPSULE | Freq: Two times a day (BID) | ORAL | 0 refills | Status: AC

## 2023-10-06 NOTE — Progress Notes (Signed)
Subjective:  Patient ID: Timothy Carr, male    DOB: Aug 23, 1961, 63 y.o.   MRN: [id number]  Patient Care Team: Everrett Coombe, DO as PCP - General (Family Medicine)   Chief Complaint:  Cough, Sore Throat, and Nasal Congestion   HPI: Timothy Carr is a 63 y.o. male presenting on 10/06/2023 for Cough, Sore Throat, and Nasal Congestion   1. COVID-19 virus infection Pt presents with cough and S&S of URI. Pt c/o copious green secretions in AM since Friday 10/02/2023 that have progressively gotten worse. Pt also noted to have fever of 100.6 deg farenheit in clinic. Pts conjuctiva erythremic. Pharnyx slightly red. Turbinates slightly red and inflamed. Pts sounds congested with frequent throat clearing and coughing.  Pt positive for Covid-19 per point of care test.  -Start albuterol (VENTOLIN HFA) 108 (90 Base) MCG/ACT inhaler 2 puff every 6 hours PRN for SOB & wheezing. -Start dextromethorphan-guaiFENesin (MUCINEX DM) 30-60mg  PO BID for secretions  -Start HYDROcodone bit-homatropine (HYDROMET) 5-1.5 MG/5ML syrup 5ml PO every 6 hours for cough. -Start molnupiravir EUA (LAGEVRIO) 200 mg CAPS capsule 800mg  PO BID x 5 days to treat Covid-19 virus  Seek medical care if not feeling better in 7 days. Wear mask when around other people. Per CDC recommendations Pt may return to work 10/09/2023 wearing a mask if feeling better and not running and fever.  Relevant past medical, surgical, family, and social history reviewed and updated as indicated.  Allergies and medications reviewed and updated. Data reviewed: Chart in Epic.   Past Medical History:  Diagnosis Date   COVID-19 10/2020    No past surgical history on file.  Social History   Socioeconomic History   Marital status: Married    Spouse name: Not on file   Number of children: Not on file   Years of education: Not on file   Highest education level: Not on file  Occupational History   Not on file  Tobacco Use   Smoking status:  Never   Smokeless tobacco: Never  Vaping Use   Vaping status: Never Used  Substance and Sexual Activity   Alcohol use: Yes    Comment: socially   Drug use: Never   Sexual activity: Yes  Other Topics Concern   Not on file  Social History Narrative   Not on file   Social Drivers of Health   Financial Resource Strain: Not on file  Food Insecurity: Not on file  Transportation Needs: Not on file  Physical Activity: Not on file  Stress: Not on file  Social Connections: Unknown (01/25/2022)   Received from [facility], [facility]   Social Network    Social Network: Not on file  Intimate Partner Violence: Unknown (12/17/2021)   Received from [facility], [facility]   HITS    Physically Hurt: Not on file    Insult or Talk Down To: Not on file    Threaten Physical Harm: Not on file    Scream or Curse: Not on file    Outpatient Encounter Medications as of 10/06/2023  Medication Sig   [DISCONTINUED] albuterol (VENTOLIN HFA) 108 (90 Base) MCG/ACT inhaler Inhale 2 puffs into the lungs every 6 (six) hours as needed for wheezing or shortness of breath.   [DISCONTINUED] fluticasone-salmeterol (WIXELA INHUB) 100-50 MCG/ACT AEPB Inhale 1 puff into the lungs 2 (two) times daily.   [DISCONTINUED] HYDROcodone bit-homatropine (HYCODAN) 5-1.5 MG/5ML syrup Take 5 mLs by mouth every 8 (eight) hours as needed for cough.   [  DISCONTINUED] meloxicam (MOBIC) 15 MG tablet One tab PO every 24 hours with a meal for 2 weeks, then once every 24 hours prn pain.   No facility-administered encounter medications on file as of 10/06/2023.    No Known Allergies  Review of Systems      Objective:  BP 137/88 (BP Location: Right Arm, Cuff Size: Normal)   Pulse (!) 119   Resp 20   Ht 6\' 4"  (1.93 m)   Wt (!) 309 lb 8 oz (140.4 kg)   SpO2 96%   BMI 37.67 kg/m    Wt Readings from Last 3 Encounters:  10/06/23 (!) 309 lb 8 oz (140.4 kg)  08/19/23 299 lb (135.6 kg)  08/06/23 (!) 304 lb (137.9 kg)     Physical Exam  No results found for this or any previous visit.     Pertinent labs & imaging results that were available during my care of the patient were reviewed by me and considered in my medical decision making.  Continue all other maintenance medications.  Continue healthy lifestyle choices, including diet (rich in fruits, vegetables, and lean proteins, and low in salt and simple carbohydrates) and exercise (at least 30 minutes of moderate physical activity daily).  The above assessment and management plan was discussed with the patient. The patient verbalized understanding of and has agreed to the management plan. Patient is aware to call the clinic if they develop any new symptoms or if symptoms persist or worsen. Patient is aware when to return to the clinic for a follow-up visit. Patient educated on when it is appropriate to go to the emergency department.   Maryelizabeth Kaufmann Student AGNP

## 2023-10-06 NOTE — Telephone Encounter (Signed)
   Chief Complaint: sinus congestion Symptoms: cough, drainage, sore throat, runny nose Frequency: constant  Disposition: [] ED /[] Urgent Care (no appt availability in office) / [x] Appointment(In office/virtual)/ []  [place]/ [] Home Care/ [] Refused Recommended Disposition /[] [place] [facility]/ []  Follow-up with PCP Additional Notes: Pt complaining of sinus congestion worsening. Pt was seen in Dec and prescribed antibiotics. Pt stated he was feeling better until the weekend. Pt has cough, sinus pressure behind the eyes, runny nose, and  sore throat. Pt has been taking OTC allergy medication and DayQuil but isn't helping. Pt states, "I just feel terrible like I want to lie down all day." Per protocol, pt to be seen within 24 hours. Pt as appt at 10:10 this morning. RN gave care advice and pt verbalized understanding.           Copied from CRM [id number]. Topic: Clinical - Red Word Triage >> Oct 06, 2023  9:32 AM Antony Haste wrote: Red Word that prompted transfer to Nurse Triage: This patient may have a possible sinus infection, his symptoms include runny nose, sore throat, bad cough, green phlegm in his throat/mucus, and drainage. Reason for Disposition  Fever present > 3 days (72 hours)  Answer Assessment - Initial Assessment Questions 1. LOCATION: "Where does it hurt?"      Behind eyes 2. ONSET: "When did the sinus pain start?"  (e.g., hours, days)      1/18 3. SEVERITY: "How bad is the pain?"   (Scale 1-10; mild, moderate or severe)   - MILD (1-3): doesn't interfere with normal activities    - MODERATE (4-7): interferes with normal activities (e.g., work or school) or awakens from sleep   - SEVERE (8-10): excruciating pain and patient unable to do any normal activities        5 4. RECURRENT SYMPTOM: "Have you ever had sinus problems before?" If Yes, ask: "When was the last time?" and "What happened that time?"      Yes, last few weeks 5. NASAL CONGESTION: "Is the nose  blocked?" If Yes, ask: "Can you open it or must you breathe through your mouth?"     Yes then runs 6. NASAL DISCHARGE: "Do you have discharge from your nose?" If so ask, "What color?"     Green to clear 7. FEVER: "Do you have a fever?" If Yes, ask: "What is it, how was it measured, and when did it start?"      Not sure 8. OTHER SYMPTOMS: "Do you have any other symptoms?" (e.g., sore throat, cough, earache, difficulty breathing)     Sore throat, cough, stuffy, feels terrible  Protocols used: Sinus Pain or Congestion-A-AH

## 2023-10-06 NOTE — Progress Notes (Signed)
Medical screening examination/treatment was performed by qualified clinical staff member and as supervising provider I was immediately available for consultation/collaboration. I have reviewed documentation and agree with assessment and plan.  Addendum: Patient has had multiple upper respiratory illnesses since 06/2023 and endorses frustration that his symptoms will improve but never fully resolve before flaring again. Has been treated with Doxycycline, Omnicef, and steroids but sinus congestion has continued. Today, treating for COVID 19 infection as noted below. Reviewed CDC quarantine recommendations. Declined work note. Advised patient to contact our office if symptoms are not improving by Friday. If needed, consider Decadron BID for 5 days and possible antibiotic coverage at that time.   Thayer Ohm, DNP, APRN, FNP-BC [facility]

## 2023-10-08 NOTE — Telephone Encounter (Signed)
Patient seen in office 10/06/23 by Christen Butter, NP

## 2023-10-26 ENCOUNTER — Ambulatory Visit: Payer: 59 | Admitting: Family Medicine

## 2023-10-26 ENCOUNTER — Encounter: Payer: Self-pay | Admitting: Family Medicine

## 2023-10-26 VITALS — BP 149/91 | HR 73 | Ht 76.0 in | Wt 309.0 lb

## 2023-10-26 DIAGNOSIS — J0101 Acute recurrent maxillary sinusitis: Secondary | ICD-10-CM

## 2023-10-26 MED ORDER — LEVOFLOXACIN 500 MG PO TABS: 500.0000 mg | ORAL_TABLET | Freq: Every day | ORAL | 0 refills | Status: AC

## 2023-10-26 MED ORDER — HYDROCODONE BIT-HOMATROP MBR 5-1.5 MG/5ML PO SOLN: 5.0000 mL | Freq: Four times a day (QID) | ORAL | 0 refills | Status: DC | PRN

## 2023-10-26 NOTE — Assessment & Plan Note (Signed)
 Pt presents with recurrent sinusitis. In the last 4 months he has had 3 sinus infections all of which have required antibiotics from augmentin  to doxycycline  to omnicef . Have gone ahead and sent in levaquin . I do have a strong suspicion patient has chronic sinusitis that needs ENT referral  - have also given cough medicine pmp reviewed and verified with no red flags

## 2023-10-26 NOTE — Progress Notes (Signed)
 Acute Office Visit  Subjective:     Patient ID: Timothy Carr, male    DOB: July 31, 1961, 63 y.o.   MRN: [id number]  Chief Complaint  Patient presents with   Sinusitis    HPI Patient is in today for concerns for recurrent sinus infection. He was seen in December for sinus infection and given omnicef  and had doxycycline  and prednisone  prior to that. He did have COVID last month.  Review of Systems  Constitutional:  Negative for chills and fever.  HENT:  Positive for congestion and sinus pain.   Respiratory:  Positive for cough. Negative for shortness of breath.   Cardiovascular:  Negative for chest pain.  Neurological:  Negative for headaches.        Objective:    BP (!) 149/91 (BP Location: Left Arm, Patient Position: Sitting, Cuff Size: Large)   Pulse 73   Ht 6\' 4"  (1.93 m)   Wt (!) 309 lb (140.2 kg)   SpO2 99%   BMI 37.61 kg/m    Physical Exam Vitals and nursing note reviewed.  Constitutional:      General: He is not in acute distress.    Appearance: Normal appearance.  HENT:     Head: Normocephalic and atraumatic.     Right Ear: Tympanic membrane, ear canal and external ear normal.     Left Ear: Tympanic membrane, ear canal and external ear normal.     Nose: Nose normal.  Eyes:     Conjunctiva/sclera: Conjunctivae normal.  Cardiovascular:     Rate and Rhythm: Normal rate and regular rhythm.  Pulmonary:     Effort: Pulmonary effort is normal.     Breath sounds: Normal breath sounds.  Neurological:     General: No focal deficit present.     Mental Status: He is alert and oriented to person, place, and time.  Psychiatric:        Mood and Affect: Mood normal.        Behavior: Behavior normal.        Thought Content: Thought content normal.        Judgment: Judgment normal.     No results found for any visits on 10/26/23.      Assessment & Plan:   Problem List Items Addressed This Visit       Respiratory   Acute maxillary sinusitis - Primary    Pt presents with recurrent sinusitis. In the last 4 months he has had 3 sinus infections all of which have required antibiotics from augmentin  to doxycycline  to omnicef . Have gone ahead and sent in levaquin . I do have a strong suspicion patient has chronic sinusitis that needs ENT referral  - have also given cough medicine pmp reviewed and verified with no red flags      Relevant Medications   levofloxacin  (LEVAQUIN ) 500 MG tablet   HYDROcodone  bit-homatropine (HYDROMET) 5-1.5 MG/5ML syrup   Other Relevant Orders   Ambulatory referral to ENT    Meds ordered this encounter  Medications   levofloxacin  (LEVAQUIN ) 500 MG tablet    Sig: Take 1 tablet (500 mg total) by mouth daily for 7 days.    Dispense:  7 tablet    Refill:  0   HYDROcodone  bit-homatropine (HYDROMET) 5-1.5 MG/5ML syrup    Sig: Take 5 mLs by mouth every 6 (six) hours as needed for cough.    Dispense:  120 mL    Refill:  0    No follow-ups on file.  Marliss Simple  Reina Cara, DO

## 2023-11-05 ENCOUNTER — Ambulatory Visit: Payer: Self-pay | Admitting: Family Medicine

## 2023-11-05 ENCOUNTER — Emergency Department (HOSPITAL_BASED_OUTPATIENT_CLINIC_OR_DEPARTMENT_OTHER): Payer: 59

## 2023-11-05 ENCOUNTER — Emergency Department (HOSPITAL_BASED_OUTPATIENT_CLINIC_OR_DEPARTMENT_OTHER)
Admission: EM | Admit: 2023-11-05 | Discharge: 2023-11-05 | Disposition: A | Discharge status: Home / Self Care | Payer: 59 | Attending: Emergency Medicine | Admitting: Emergency Medicine

## 2023-11-05 ENCOUNTER — Encounter (HOSPITAL_BASED_OUTPATIENT_CLINIC_OR_DEPARTMENT_OTHER): Payer: Self-pay

## 2023-11-05 ENCOUNTER — Other Ambulatory Visit: Payer: Self-pay

## 2023-11-05 DIAGNOSIS — Z23 Encounter for immunization: Secondary | ICD-10-CM | POA: Diagnosis not present

## 2023-11-05 DIAGNOSIS — S51011A Laceration without foreign body of right elbow, initial encounter: Secondary | ICD-10-CM | POA: Insufficient documentation

## 2023-11-05 DIAGNOSIS — S51812A Laceration without foreign body of left forearm, initial encounter: Secondary | ICD-10-CM | POA: Diagnosis not present

## 2023-11-05 DIAGNOSIS — W010XXA Fall on same level from slipping, tripping and stumbling without subsequent striking against object, initial encounter: Secondary | ICD-10-CM | POA: Insufficient documentation

## 2023-11-05 DIAGNOSIS — W19XXXA Unspecified fall, initial encounter: Secondary | ICD-10-CM

## 2023-11-05 DIAGNOSIS — S59912A Unspecified injury of left forearm, initial encounter: Secondary | ICD-10-CM | POA: Diagnosis present

## 2023-11-05 MED ORDER — LIDOCAINE-EPINEPHRINE (PF) 2 %-1:200000 IJ SOLN
10.0000 mL | Freq: Once | INTRAMUSCULAR | Status: AC
  Administered 2023-11-05: 10 mL
  Filled 2023-11-05: qty 20

## 2023-11-05 MED ORDER — KETOROLAC TROMETHAMINE 15 MG/ML IJ SOLN
15.0000 mg | Freq: Once | INTRAMUSCULAR | Status: AC
  Administered 2023-11-05: 15 mg via INTRAMUSCULAR
  Filled 2023-11-05: qty 1

## 2023-11-05 MED ORDER — TETANUS-DIPHTH-ACELL PERTUSSIS 5-2.5-18.5 LF-MCG/0.5 IM SUSY
0.5000 mL | PREFILLED_SYRINGE | Freq: Once | INTRAMUSCULAR | Status: AC
  Administered 2023-11-05: 0.5 mL via INTRAMUSCULAR
  Filled 2023-11-05: qty 0.5

## 2023-11-05 NOTE — Discharge Instructions (Addendum)
Please return to the emergency department, urgent care, or your primary care doctor in 7 to 10 days for suture removal.  You may shower normally but do not submerge in water for extended periods of time until the sutures come out.  You may return to the emergency department sooner for any worsening symptoms including fever, purulent drainage.  You can take 600 mg of ibuprofen every 6 hours as needed for pain.

## 2023-11-05 NOTE — Telephone Encounter (Signed)
Copied from CRM [id number]. Topic: Clinical - Red Word Triage >> Nov 05, 2023  8:17 AM Nila Nephew wrote: Red Word that prompted transfer to Nurse Triage: Fall late last night with open gash on arm - did a quick bandage last night but looks like it needs stiches - did not hit head - wearing slides with trashbag and slide shoes slid and he went with them.   Chief Complaint: left arm gash Symptoms: large gash Frequency: fell last night Pertinent Negatives: Patient denies bruising or swelling or actively bleeding Disposition: [x] ED /[] Urgent Care (no appt availability in office) / [] Appointment(In office/virtual)/ []  [place]/ [] Home Care/ [] Refused Recommended Disposition /[] [place] [facility]/ []  Follow-up with PCP Additional Notes:  The patient reported that he slipped down the steps outside taking the trash last night and fell into some items stored near the steps that included a fan and paper shredder and other items and has a 2.5 x 0.5 in gash on his left arm and other small scrapes down his arm.  He was advised to go to the ED for further evaluation.  He was agreeable.  Reason for Disposition  Skin is split open or gaping (or length > 1/2 inch or 12 mm)  Answer Assessment - Initial Assessment Questions 1. MECHANISM: "How did the injury happen?"     Fell last night taking the trash; fell down the steps and fell on some items being stored, unsure what actually cut him 2. ONSET: "When did the injury happen?" (Minutes or hours ago)      Last night  3. LOCATION: "Where is the injury located?" "Which arm?"     Left arm; inside arm but below curve of elbow 4. APPEARANCE of INJURY: "What does the injury look like?"      "Wide open" 2.5 inches long 0.5 5. SEVERITY: "Can you use the arm normally?"      Yes  6. SWELLING or BRUISING: "is there any swelling or bruising?" If Yes, ask: "How large is it? (e.g., inches, centimeters)      No swelling or bruising yet 7. PAIN: "Is there  pain?" If Yes, ask: "How bad is the pain?"    (Scale 1-10; or mild, moderate, severe)   - NONE (0): No pain.   - MILD (1-3): Doesn't interfere with normal activities.   - MODERATE (4-7): Interferes with normal activities (e.g., work or school) or awakens from sleep.   - SEVERE (8-10): Excruciating pain, unable to do any normal activities, unable to hold a cup of water.     Moderate  8. TETANUS: For any breaks in the skin, ask: "When was the last tetanus booster?"     Unsure  9. OTHER SYMPTOMS: "Do you have any other symptoms?"  (e.g., numbness in hand)     none  Protocols used: Arm Injury-A-AH

## 2023-11-05 NOTE — ED Triage Notes (Signed)
In for eval of mechanical fall down garage steps at 2300 last pm. He cleaned and dressed his wounds last night. Tdap unknown.

## 2023-11-05 NOTE — ED Provider Notes (Signed)
 [facility] Provider Note   CSN: [id number] Arrival date & time: 11/05/23  5784     History Chief Complaint  Patient presents with   Marletta Lor    Timothy Carr is a 64 y.o. male patient who presents to the emergency department today for further evaluation of a mechanical trip and fall that occurred last night.  He was walking down the steps of his garage when he tripped and fell forward.  And that he has a laceration to the left forearm and a laceration to the right elbow.  Did not hit his head or lose consciousness.  He denies any other injuries.   Fall       Home Medications Prior to Admission medications   Medication Sig Start Date End Date Taking? Authorizing Provider  albuterol (VENTOLIN HFA) 108 (90 Base) MCG/ACT inhaler Inhale 2 puffs into the lungs every 6 (six) hours as needed for wheezing or shortness of breath. 10/06/23   Christen Butter, NP  dextromethorphan-guaiFENesin (MUCINEX DM) 30-600 MG 12hr tablet Take 1 tablet by mouth 2 (two) times daily. Patient not taking: Reported on 10/26/2023 10/06/23   Christen Butter, NP  HYDROcodone bit-homatropine (HYDROMET) 5-1.5 MG/5ML syrup Take 5 mLs by mouth every 6 (six) hours as needed for cough. 10/26/23   Charlton Amor, DO      Allergies    Patient has no known allergies.    Review of Systems   Review of Systems  All other systems reviewed and are negative.   Physical Exam Updated Vital Signs BP (!) 132/90 (BP Location: Right Arm)   Pulse 77   Temp 97.8 F (36.6 C) (Oral)   Resp 20   Ht 6\' 4"  (1.93 m)   Wt (!) 136.5 kg   SpO2 100%   BMI 36.64 kg/m  Physical Exam Vitals and nursing note reviewed.  Constitutional:      Appearance: Normal appearance.  HENT:     Head: Normocephalic and atraumatic.  Eyes:     General:        Right eye: No discharge.        Left eye: No discharge.     Conjunctiva/sclera: Conjunctivae normal.  Pulmonary:     Effort: Pulmonary effort is  normal.  Skin:    General: Skin is warm and dry.     Findings: No rash.     Comments: 5 inch linear laceration to the left anterior forearm.  There is also a small 1.5 inch U-shaped laceration to the posterior right forearm near the elbow.  Neurological:     General: No focal deficit present.     Mental Status: He is alert.  Psychiatric:        Mood and Affect: Mood normal.        Behavior: Behavior normal.     ED Results / Procedures / Treatments   Labs (all labs ordered are listed, but only abnormal results are displayed) Labs Reviewed - No data to display  EKG None  Radiology DG Forearm Left Result Date: 11/05/2023 CLINICAL DATA:  Laceration forearm EXAM: LEFT FOREARM - 2 VIEW COMPARISON:  None Available. FINDINGS: No acute fracture or dislocation. Joint spaces and alignment are maintained. No area of erosion or osseous destruction. No unexpected radiopaque foreign body. Soft tissues are unremarkable. IMPRESSION: No acute fracture or dislocation. Electronically Signed   By: Meda Klinefelter M.D.   On: 11/05/2023 11:13    Procedures .Laceration Repair  Date/Time: 11/05/2023  12:59 PM  Performed by: Teressa Lower, PA-C Authorized by: Teressa Lower, PA-C   Consent:    Consent obtained:  Verbal   Consent given by:  Patient   Risks discussed:  Infection and need for additional repair Universal protocol:    Procedure explained and questions answered to patient or proxy's satisfaction: yes     Relevant documents present and verified: yes     Test results available: yes     Imaging studies available: yes     Required blood products, implants, devices, and special equipment available: yes     Site/side marked: yes     Immediately prior to procedure, a time out was called: no     Patient identity confirmed:  Verbally with patient and arm band Anesthesia:    Anesthesia method:  Local infiltration   Local anesthetic:  Lidocaine 2% WITH epi Laceration details:     Location: left anterior forearm.   Length (cm):  10.5   Depth (mm):  4 Pre-procedure details:    Preparation:  Patient was prepped and draped in usual sterile fashion Exploration:    Limited defect created (wound extended): no     Hemostasis achieved with:  Epinephrine and direct pressure   Imaging obtained: x-ray     Imaging outcome: foreign body not noted     Wound exploration: wound explored through full range of motion     Wound extent: areolar tissue violated     Wound extent: fascia not violated, no foreign body, no signs of injury, no nerve damage, no tendon damage, no underlying fracture and no vascular damage     Contaminated: yes   Treatment:    Area cleansed with:  Saline   Amount of cleaning:  Extensive   Irrigation solution:  Sterile saline   Irrigation volume:  .5L   Irrigation method:  Pressure wash   Debridement:  None   Undermining:  None Skin repair:    Repair method:  Sutures   Suture size:  4-0   Suture material:  Prolene   Suture technique:  Simple interrupted   Number of sutures:  8 Approximation:    Approximation:  Close Repair type:    Repair type:  Intermediate Post-procedure details:    Procedure completion:  Tolerated well, no immediate complications .Laceration Repair  Date/Time: 11/05/2023 1:01 PM  Performed by: Teressa Lower, PA-C Authorized by: Teressa Lower, PA-C   Consent:    Consent obtained:  Verbal   Consent given by:  Patient   Risks, benefits, and alternatives were discussed: yes     Risks discussed:  Infection Universal protocol:    Procedure explained and questions answered to patient or proxy's satisfaction: yes     Relevant documents present and verified: yes     Test results available: yes     Imaging studies available: no     Required blood products, implants, devices, and special equipment available: no     Site/side marked: yes     Immediately prior to procedure, a time out was called: no     Patient identity  confirmed:  Verbally with patient and arm band Anesthesia:    Anesthesia method:  Local infiltration   Local anesthetic:  Lidocaine 2% WITH epi Laceration details:    Location: right posterior forearm.   Length (cm):  4 Pre-procedure details:    Preparation:  Patient was prepped and draped in usual sterile fashion Exploration:    Hemostasis achieved with:  Epinephrine  and direct pressure   Imaging outcome: foreign body not noted     Wound exploration: wound explored through full range of motion     Wound extent: areolar tissue violated     Wound extent: fascia not violated, no foreign body, no signs of injury, no nerve damage, no tendon damage, no underlying fracture and no vascular damage   Treatment:    Area cleansed with:  Povidone-iodine   Amount of cleaning:  Extensive   Irrigation solution:  Sterile saline   Irrigation volume:  .25L   Irrigation method:  Pressure wash   Debridement:  None   Undermining:  None   Scar revision: no   Skin repair:    Repair method:  Sutures   Suture size:  4-0   Suture material:  Prolene   Suture technique:  Simple interrupted   Number of sutures:  4     Medications Ordered in ED Medications  Tdap (BOOSTRIX) injection 0.5 mL (0.5 mLs Intramuscular Given 11/05/23 1031)  lidocaine-EPINEPHrine (XYLOCAINE W/EPI) 2 %-1:200000 (PF) injection 10 mL (10 mLs Infiltration Given by Other 11/05/23 1138)  ketorolac (TORADOL) 15 MG/ML injection 15 mg (15 mg Intramuscular Given 11/05/23 1145)    ED Course/ Medical Decision Making/ A&P Clinical Course as of 11/05/23 1307  Thu Nov 05, 2023  1304 DG Forearm Left I personally ordered interpreted the study and do not see any evidence of fracture or obvious foreign body.  I do agree with the radiologist interpretation. [CF]    Clinical Course User Index [CF] Teressa Lower, PA-C   {   Click here for ABCD2, HEART and other calculators  Medical Decision Making Timothy Carr is a 63 y.o. male  patient presents to the emergency department today for further evaluation of a mechanical trip and fall and sustained 2 lacerations.  Tdap is not up-to-date no update this now.  Will get an x-ray of the left forearm to look for any foreign bodies.  Will plan to look for foreign bodies with visual inspection of the right laceration since it is much smaller.  Will plan to perform wound closure with suture. Wounds are about 12 hours old.   I closed both lacerations with sutures.  Please see procedure note above for further detail.  He will return here, go to urgent care, or his PCP in 7 to 10 days for suture removal.  Appropriate wound care was discussed with him at length at the bedside.  All questions and concerns addressed.  Amount and/or Complexity of Data Reviewed Radiology: ordered.  Risk Prescription drug management.    Final Clinical Impression(s) / ED Diagnoses Final diagnoses:  Fall, initial encounter  Laceration of left forearm, initial encounter  Laceration of right elbow, initial encounter    Rx / DC Orders ED Discharge Orders     None         Teressa Lower, [place] 11/05/23 1307    Vanetta Mulders, MD 11/07/23 [phone]

## 2023-11-05 NOTE — ED Notes (Signed)
 Patient given discharge instructions. Questions were answered. Patient verbalized understanding of discharge instructions and care at home.

## 2023-11-12 ENCOUNTER — Ambulatory Visit: Payer: 59 | Admitting: Family Medicine

## 2023-11-12 ENCOUNTER — Encounter: Payer: Self-pay | Admitting: Family Medicine

## 2023-11-12 VITALS — BP 140/92 | HR 92 | Ht 76.0 in | Wt 318.0 lb

## 2023-11-12 DIAGNOSIS — R052 Subacute cough: Secondary | ICD-10-CM | POA: Diagnosis not present

## 2023-11-12 DIAGNOSIS — S4452XA Injury of cutaneous sensory nerve at shoulder and upper arm level, left arm, initial encounter: Secondary | ICD-10-CM | POA: Insufficient documentation

## 2023-11-12 DIAGNOSIS — T07XXXA Unspecified multiple injuries, initial encounter: Secondary | ICD-10-CM | POA: Insufficient documentation

## 2023-11-12 MED ORDER — HYDROCODONE BIT-HOMATROP MBR 5-1.5 MG/5ML PO SOLN: 5.0000 mL | Freq: Four times a day (QID) | ORAL | 0 refills | Status: DC | PRN

## 2023-11-12 NOTE — Progress Notes (Signed)
 Timothy Carr - 63 y.o. male MRN [id number]  Date of birth: 15-Jul-1961  Subjective Chief Complaint  Patient presents with   Suture / Staple Removal    HPI Timothy Carr is a 63 year old male here today for suture removal.  He had laceration to the left forearm as well as the right elbow after experiencing a fall while carrying out his garbage.  He was seen in the ED and had repair with suture.  He denies any signs or symptoms of infection.    He also would like renewal of cough syrup.  He has ENT appointment but not for a few weeks.  ROS:  A comprehensive ROS was completed and negative except as noted per HPI  No Known Allergies  Past Medical History:  Diagnosis Date   COVID-19 10/2020    History reviewed. No pertinent surgical history.  Social History   Socioeconomic History   Marital status: Married    Spouse name: Not on file   Number of children: Not on file   Years of education: Not on file   Highest education level: Not on file  Occupational History   Not on file  Tobacco Use   Smoking status: Never   Smokeless tobacco: Never  Vaping Use   Vaping status: Never Used  Substance and Sexual Activity   Alcohol use: Yes    Comment: socially   Drug use: Never   Sexual activity: Yes  Other Topics Concern   Not on file  Social History Narrative   Not on file   Social Drivers of Health   Financial Resource Strain: Not on file  Food Insecurity: Not on file  Transportation Needs: Not on file  Physical Activity: Not on file  Stress: Not on file  Social Connections: Unknown (01/25/2022)   Received from [facility], [facility]   Social Network    Social Network: Not on file    Family History  Problem Relation Age of Onset   Healthy Mother    Heart failure Father    Cancer Maternal Grandmother    Stroke Maternal Grandmother     Health Maintenance  Topic Date Due   HIV Screening  Never done   Hepatitis C Screening  Never done   Colonoscopy   Never done   Zoster Vaccines- Shingrix (1 of 2) Never done   COVID-19 Vaccine (3 - 2024-25 season) 05/17/2023   INFLUENZA VACCINE  12/14/2023 (Originally 04/16/2023)   DTaP/Tdap/Td (2 - Td or Tdap) 11/04/2033   HPV VACCINES  Aged Out     ----------------------------------------------------------------------------------------------------------------------------------------------------------------------------------------------------------------- Physical Exam BP (!) 140/92 (BP Location: Left Arm, Patient Position: Sitting, Cuff Size: Large)   Pulse 92   Ht 6\' 4"  (1.93 m)   Wt (!) 318 lb (144.2 kg)   SpO2 96%   BMI 38.71 kg/m   Physical Exam Skin:    Comments: Laceration to left forearm appears to be healing well.  No signs of significant erythema or drainage.  Laceration to right elbow area appears to be healing well.  No drainage or significant erythema at this area either.     ------------------------------------------------------------------------------------------------------------------------------------------------------------------------------------------------------------------- Assessment and Plan  Laceration of multiple sites of skin 8 sutures removed without difficulty from the left forearm.  3 sutures removed from the right elbow without difficulty.  Steri-Strips applied to each of these areas.  Red flags reviewed.  Subacute cough Hycodan cough syrup renewed.   Meds ordered this encounter  Medications   HYDROcodone bit-homatropine (HYDROMET) 5-1.5 MG/5ML syrup  Sig: Take 5 mLs by mouth every 6 (six) hours as needed for cough.    Dispense:  120 mL    Refill:  0    No follow-ups on file.    This visit occurred during the SARS-CoV-2 public health emergency.  Safety protocols were in place, including screening questions prior to the visit, additional usage of staff PPE, and extensive cleaning of exam room while observing appropriate contact time as indicated for  disinfecting solutions.

## 2023-11-12 NOTE — Assessment & Plan Note (Signed)
 8 sutures removed without difficulty from the left forearm.  3 sutures removed from the right elbow without difficulty.  Steri-Strips applied to each of these areas.  Red flags reviewed.

## 2023-11-12 NOTE — Assessment & Plan Note (Signed)
 Hycodan cough syrup renewed.

## 2023-12-17 ENCOUNTER — Ambulatory Visit (INDEPENDENT_AMBULATORY_CARE_PROVIDER_SITE_OTHER): Payer: 59 | Admitting: Otolaryngology

## 2023-12-17 ENCOUNTER — Encounter (INDEPENDENT_AMBULATORY_CARE_PROVIDER_SITE_OTHER): Payer: Self-pay | Admitting: Otolaryngology

## 2023-12-17 VITALS — BP 153/101 | HR 75 | Ht 77.0 in | Wt 310.0 lb

## 2023-12-17 DIAGNOSIS — J343 Hypertrophy of nasal turbinates: Secondary | ICD-10-CM

## 2023-12-17 DIAGNOSIS — R0982 Postnasal drip: Secondary | ICD-10-CM | POA: Diagnosis not present

## 2023-12-17 DIAGNOSIS — J342 Deviated nasal septum: Secondary | ICD-10-CM

## 2023-12-17 DIAGNOSIS — K219 Gastro-esophageal reflux disease without esophagitis: Secondary | ICD-10-CM

## 2023-12-17 DIAGNOSIS — R0981 Nasal congestion: Secondary | ICD-10-CM

## 2023-12-17 DIAGNOSIS — J3089 Other allergic rhinitis: Secondary | ICD-10-CM

## 2023-12-17 DIAGNOSIS — H6123 Impacted cerumen, bilateral: Secondary | ICD-10-CM | POA: Diagnosis not present

## 2023-12-17 DIAGNOSIS — J312 Chronic pharyngitis: Secondary | ICD-10-CM

## 2023-12-17 MED ORDER — LEVOCETIRIZINE DIHYDROCHLORIDE 5 MG PO TABS
5.0000 mg | ORAL_TABLET | Freq: Every evening | ORAL | 3 refills | Status: DC
Start: 2023-12-17 — End: 2024-04-13

## 2023-12-17 MED ORDER — FLUTICASONE PROPIONATE 50 MCG/ACT NA SUSP: 2.0000 | Freq: Two times a day (BID) | NASAL | 6 refills | Status: DC

## 2023-12-17 MED ORDER — FAMOTIDINE 20 MG PO TABS: 20.0000 mg | ORAL_TABLET | Freq: Two times a day (BID) | ORAL | 3 refills | Status: AC

## 2023-12-17 NOTE — Patient Instructions (Signed)
-   Debrox - use it once a day x 3-4 weeks and come back for ear cleaning   GamingLesson.nl - check out this website to learn more about reflux   -Avoid lying down for at least two hours after a meal or after drinking acidic beverages, like soda, or other caffeinated beverages. This can help to prevent stomach contents from flowing back into the esophagus. -Keep your head elevated while you sleep. Using an extra pillow or two can also help to prevent reflux. -Eat smaller and more frequent meals each day instead of a few large meals. This promotes digestion and can aid in preventing heartburn. -Wear loose-fitting clothes to ease pressure on the stomach, which can worsen heartburn and reflux. -Reduce excess weight around the midsection. This can ease pressure on the stomach. Such pressure can force some stomach contents back up the esophagus - Take Reflux Gourmet (natural supplement available on Amazon) to help with symptoms of chronic throat irritation      Lloyd Huger Med Nasal Saline Rinse   - start nasal saline rinses with NeilMed Bottle available over the counter or online to help with nasal congestion

## 2023-12-17 NOTE — Progress Notes (Signed)
 ENT CONSULT:  Reason for Consult: chronic nasal congestion and post-nasal drainage, phlegm   HPI: Discussed the use of AI scribe software for clinical note transcription with the patient, who gave verbal consent to proceed.  History of Present Illness Timothy Carr is a 63 year old male who presents with chronic nasal congestion and postnasal drainage/ sensation of phlegm in his throat.  He has experienced chronic nasal congestion and postnasal drainage since October, 2024, with a persistent sensation of phlegm or buildup in his nose and throat, particularly bothersome in the mornings, often leading to nausea due to drainage into his throat. Symptoms have improved since last year but remain present and are described as 'very draining.' No history of heartburn or reflux, but there is a recent increase in heartburn symptoms. A chest x-ray performed in October was clear.  He uses over-the-counter medications including a generic version of Nasacort once daily, Mucinex, and an allergy pill. Additionally, he uses albuterol spray, although he does not report significant breathing difficulties, just a feeling of congestion. He has previously been prescribed a prednisone pack and other steroids, which did not provide relief.  He reports his ears feeling stuffed up and has attempted to remove earwax at home. No history of smoking. He has lived in the same house since 1991, with no recent changes in environment.  Occasional coughing occurs, especially when drainage occurs into his throat. No significant breathing difficulties.   Records Reviewed:  PCP office visit 08/06/23 Timothy Carr is a  63 y.o. male here today with continued cough.  He has had cough for about 2 month.  Treated with doxycyline and prednisone last month.  Albuterol as needed was added as well.  CXR unremarkable.  Denies fever or chills. He has not had dyspnea.  He does feel that the prednisone helped previously.   Subacute  cough He did seem to have improvement with steroids.  Adding longer taper of prednisone and will add wixela inhaler.  Recommend addition of flonase daily.  If not improving with this will plan to refer to pulmonology.       Past Medical History:  Diagnosis Date   COVID-19 10/2020    History reviewed. No pertinent surgical history.  Family History  Problem Relation Age of Onset   Healthy Mother    Heart failure Father    Cancer Maternal Grandmother    Stroke Maternal Grandmother     Social History:  reports that he has never smoked. He has never used smokeless tobacco. He reports current alcohol use. He reports that he does not use drugs.  Allergies: No Known Allergies  Medications: I have reviewed the patient's current medications.  The PMH, PSH, Medications, Allergies, and SH were reviewed and updated.  ROS: Constitutional: Negative for fever, weight loss and weight gain. Cardiovascular: Negative for chest pain and dyspnea on exertion. Respiratory: Is not experiencing shortness of breath at rest. Gastrointestinal: Negative for nausea and vomiting. Neurological: Negative for headaches. Psychiatric: The patient is not nervous/anxious  Blood pressure (!) 153/101, pulse 75, height 6\' 5"  (1.956 m), weight (!) 310 lb (140.6 kg), SpO2 99%. Body mass index is 36.76 kg/m.  PHYSICAL EXAM:  Exam: General: Well-developed, well-nourished Respiratory Respiratory effort: Equal inspiration and expiration without stridor Cardiovascular Peripheral Vascular: Warm extremities with equal color/perfusion Eyes: No nystagmus with equal extraocular motion bilaterally Neuro/Psych/Balance: Patient oriented to person, place, and time; Appropriate mood and affect; Gait is intact with no imbalance; Cranial nerves I-XII are intact Head  and Face Inspection: Normocephalic and atraumatic without mass or lesion Palpation: Facial skeleton intact without bony stepoffs Salivary Glands: No mass or  tenderness Facial Strength: Facial motility symmetric and full bilaterally ENT Pinna: External ear intact and fully developed External canal: Canals with cerumen impaction b/l External Nose: No scar or anatomic deformity Internal Nose: Septum is deviated to the right. No polyp, or purulence. Mucosal edema and erythema present.  Bilateral inferior turbinate hypertrophy.  Lips, Teeth, and gums: Mucosa and teeth intact and viable TMJ: No pain to palpation with full mobility Oral cavity/oropharynx: No erythema or exudate, no lesions present Nasopharynx: No mass or lesion with intact mucosa Hypopharynx: Intact mucosa without pooling of secretions Larynx Glottic: Full true vocal cord mobility without lesion or mass Supraglottic: Normal appearing epiglottis and AE folds Interarytenoid Space: Moderate pachydermia&edema Subglottic Space: Patent without lesion or edema Neck Neck and Trachea: Midline trachea without mass or lesion Thyroid: No mass or nodularity Lymphatics: No lymphadenopathy  Procedure: Preoperative diagnosis: phlegm/globus   Postoperative diagnosis:   Same + GERD LPR  Procedure: Flexible fiberoptic laryngoscopy  Surgeon: Ashok Croon, MD  Anesthesia: Topical lidocaine and Afrin Complications: None Condition is stable throughout exam  Indications and consent:  The patient presents to the clinic with above symptoms. Indirect laryngoscopy view was incomplete. Thus it was recommended that they undergo a flexible fiberoptic laryngoscopy. All of the risks, benefits, and potential complications were reviewed with the patient preoperatively and verbal informed consent was obtained.  Procedure: The patient was seated upright in the clinic. Topical lidocaine and Afrin were applied to the nasal cavity. After adequate anesthesia had occurred, I then proceeded to pass the flexible telescope into the nasal cavity. The nasal cavity was patent without rhinorrhea or polyp. The  nasopharynx was also patent without mass or lesion. The base of tongue was visualized and was normal. There were no signs of pooling of secretions in the piriform sinuses. The true vocal folds were mobile bilaterally. There were no signs of glottic or supraglottic mucosal lesion or mass. There was moderate interarytenoid pachydermia and post cricoid edema. The telescope was then slowly withdrawn and the patient tolerated the procedure throughout.    PROCEDURE NOTE: nasal endoscopy  Preoperative diagnosis: chronic sinusitis symptoms  Postoperative diagnosis: same  Procedure: Diagnostic nasal endoscopy (96045)  Surgeon: Ashok Croon, M.D.  Anesthesia: Topical lidocaine and Afrin  H&P REVIEW: The patient's history and physical were reviewed today prior to procedure. All medications were reviewed and updated as well. Complications: None Condition is stable throughout exam Indications and consent: The patient presents with symptoms of chronic sinusitis not responding to previous therapies. All the risks, benefits, and potential complications were reviewed with the patient preoperatively and informed consent was obtained. The time out was completed with confirmation of the correct procedure.   Procedure: The patient was seated upright in the clinic. Topical lidocaine and Afrin were applied to the nasal cavity. After adequate anesthesia had occurred, the rigid nasal endoscope was passed into the nasal cavity. The nasal mucosa, turbinates, septum, and sinus drainage pathways were visualized bilaterally. This revealed no purulence or significant secretions that might be cultured. There were no polyps or sites of significant inflammation. The mucosa was intact and there was no crusting present. The scope was then slowly withdrawn and the patient tolerated the procedure well. There were no complications or blood loss.      Studies Reviewed: CXR 07/10/23  FINDINGS: The heart size and mediastinal  contours are within normal limits.  Both lungs are clear. The visualized skeletal structures are unremarkable.   IMPRESSION: No active cardiopulmonary disease.  Assessment/Plan: No diagnosis found.  Assessment and Plan Assessment & Plan Chronic nasal congestion Postnasal drainage likely due to environmental allergies Chronic postnasal drainage with phlegm sensation suggests allergic etiology vs GERD LPR. Flexible laryngoscopy and nasal endoscopy w/o pus or purulence, and no pooling of phlegm or secretions in hypopharynx. He had findings c/w GERD LPR and mucosal edema/post-nasal drainage b/l on nasal endoscopy. Current treatment provides partial relief. - Prescribe Xyzal 5 mg QHS for allergy management. - Prescribe Flonase nasal spray 2 puffs b/l nares BID - Refer for allergy testing. - Advised nasal rinses with NeilMed bottle.  Chronic GERD LPR Symptoms suggest silent reflux with recent heartburn increase and flexible scope exam findings c/w it. Trial of reflux medication recommended. - Prescribed Pepcid 20 mg BID - Recommend dietary and lifestyle modifications. - Advise use of Reflux Gourmet supplement after meals.   Cerumen impaction, b/l Earwax noted on exam Ear cleaning recommended. - Schedule appointment for ear cleaning  - Advise use of Debrox ear drops once daily for 2-4 weeks prior to ear cleaning   Follow-up Follow-up plans to monitor symptom control and adjust treatment. - Schedule follow-up appointment after allergy testing and ear cleaning.   Thank you for allowing me to participate in the care of this patient. Please do not hesitate to contact me with any questions or concerns.   Ashok Croon, MD Otolaryngology [facility] Phone: [phone] Fax: [phone]    12/17/2023, 2:24 PM

## 2024-01-04 ENCOUNTER — Encounter: Payer: Self-pay | Admitting: Family Medicine

## 2024-01-04 ENCOUNTER — Ambulatory Visit (INDEPENDENT_AMBULATORY_CARE_PROVIDER_SITE_OTHER): Admitting: Family Medicine

## 2024-01-04 VITALS — BP 131/81 | HR 74 | Ht 76.5 in | Wt 318.0 lb

## 2024-01-04 DIAGNOSIS — Z0000 Encounter for general adult medical examination without abnormal findings: Secondary | ICD-10-CM | POA: Diagnosis not present

## 2024-01-04 DIAGNOSIS — Z125 Encounter for screening for malignant neoplasm of prostate: Secondary | ICD-10-CM | POA: Diagnosis not present

## 2024-01-04 DIAGNOSIS — Z13220 Encounter for screening for lipoid disorders: Secondary | ICD-10-CM

## 2024-01-04 DIAGNOSIS — Z1211 Encounter for screening for malignant neoplasm of colon: Secondary | ICD-10-CM | POA: Diagnosis not present

## 2024-01-04 DIAGNOSIS — R5383 Other fatigue: Secondary | ICD-10-CM

## 2024-01-04 NOTE — Assessment & Plan Note (Signed)
 Well adult Orders Placed This Encounter  Procedures   CMP14+EGFR   CBC with Differential/Platelet   PSA   Lipid Panel With LDL/HDL Ratio   TSH   Ambulatory referral to Gastroenterology    Referral Priority:   Routine    Referral Type:   Consultation    Referral Reason:   Specialty Services Required    Number of Visits Requested:   1   Ambulatory referral to Sleep Studies    Referral Priority:   Routine    Referral Type:   Consultation    Referral Reason:   Specialty Services Required    Number of Visits Requested:   1  Screenings: Per lab orders Immunizations:  UTD Anticipatory guidance/Risk factor reduction:  Recommendations per AVS.

## 2024-01-04 NOTE — Progress Notes (Signed)
 STROTHER EVERITT - 63 y.o. male MRN [id number]  Date of birth: October 17, 1960  Subjective Chief Complaint  Patient presents with   Annual Exam    HPI HARUN BRUMLEY is a 63 y.o. male here today for annual exam.   He reports that he is doing pretty well.   Has had some fatigue.  Thinks is he sleeping pretty well but wouldn't be opposed to OSA testing.   StoP-BANG: 5  Diet and activity level could be better he admits.   He is a non-smoker.  Occasional EtOH use.   Review of Systems  Constitutional:  Negative for chills, fever, malaise/fatigue and weight loss.  HENT:  Negative for congestion, ear pain and sore throat.   Eyes:  Negative for blurred vision, double vision and pain.  Respiratory:  Negative for cough and shortness of breath.   Cardiovascular:  Negative for chest pain and palpitations.  Gastrointestinal:  Negative for abdominal pain, blood in stool, constipation, heartburn and nausea.  Genitourinary:  Negative for dysuria and urgency.  Musculoskeletal:  Negative for joint pain and myalgias.  Neurological:  Negative for dizziness and headaches.  Endo/Heme/Allergies:  Does not bruise/bleed easily.  Psychiatric/Behavioral:  Negative for depression. The patient is not nervous/anxious and does not have insomnia.     No Known Allergies  Past Medical History:  Diagnosis Date   COVID-19 10/2020    History reviewed. No pertinent surgical history.  Social History   Socioeconomic History   Marital status: Married    Spouse name: Not on file   Number of children: Not on file   Years of education: Not on file   Highest education level: Not on file  Occupational History   Not on file  Tobacco Use   Smoking status: Never   Smokeless tobacco: Never  Vaping Use   Vaping status: Never Used  Substance and Sexual Activity   Alcohol use: Yes    Comment: socially   Drug use: Never   Sexual activity: Yes  Other Topics Concern   Not on file  Social History Narrative   Not  on file   Social Drivers of Health   Financial Resource Strain: Not on file  Food Insecurity: Not on file  Transportation Needs: Not on file  Physical Activity: Not on file  Stress: Not on file  Social Connections: Unknown (01/25/2022)   Received from [facility]   Social Network    Social Network: Not on file    Family History  Problem Relation Age of Onset   Healthy Mother    Heart failure Father    Cancer Maternal Grandmother    Stroke Maternal Grandmother     Health Maintenance  Topic Date Due   HIV Screening  Never done   Hepatitis C Screening  Never done   Colonoscopy  Never done   Zoster Vaccines- Shingrix (1 of 2) Never done   COVID-19 Vaccine (3 - 2024-25 season) 05/17/2023   INFLUENZA VACCINE  04/15/2024   DTaP/Tdap/Td (2 - Td or Tdap) 11/04/2033   HPV VACCINES  Aged Out   Meningococcal B Vaccine  Aged Out     ----------------------------------------------------------------------------------------------------------------------------------------------------------------------------------------------------------------- Physical Exam BP 131/81   Pulse 74   Ht 6' 4.5" (1.943 m)   Wt (!) 318 lb (144.2 kg)   SpO2 98%   BMI 38.20 kg/m   Physical Exam Constitutional:      General: He is not in acute distress. HENT:     Head: Normocephalic and  atraumatic.     Right Ear: Tympanic membrane and external ear normal.     Left Ear: Tympanic membrane and external ear normal.  Eyes:     General: No scleral icterus. Neck:     Thyroid: No thyromegaly.  Cardiovascular:     Rate and Rhythm: Normal rate and regular rhythm.     Heart sounds: Normal heart sounds.  Pulmonary:     Effort: Pulmonary effort is normal.     Breath sounds: Normal breath sounds.  Abdominal:     General: Bowel sounds are normal. There is no distension.     Palpations: Abdomen is soft.     Tenderness: There is no abdominal tenderness. There is no guarding.  Musculoskeletal:      Cervical back: Normal range of motion.  Lymphadenopathy:     Cervical: No cervical adenopathy.  Skin:    General: Skin is warm and dry.     Findings: No rash.  Neurological:     Mental Status: He is alert and oriented to person, place, and time.     Cranial Nerves: No cranial nerve deficit.     Motor: No abnormal muscle tone.  Psychiatric:        Mood and Affect: Mood normal.        Behavior: Behavior normal.     ------------------------------------------------------------------------------------------------------------------------------------------------------------------------------------------------------------------- Assessment and Plan  Well adult exam Well adult Orders Placed This Encounter  Procedures   CMP14+EGFR   CBC with Differential/Platelet   PSA   Lipid Panel With LDL/HDL Ratio   TSH   Ambulatory referral to Gastroenterology    Referral Priority:   Routine    Referral Type:   Consultation    Referral Reason:   Specialty Services Required    Number of Visits Requested:   1   Ambulatory referral to Sleep Studies    Referral Priority:   Routine    Referral Type:   Consultation    Referral Reason:   Specialty Services Required    Number of Visits Requested:   1  Screenings: Per lab orders Immunizations:  UTD Anticipatory guidance/Risk factor reduction:  Recommendations per AVS.    No orders of the defined types were placed in this encounter.   No follow-ups on file.

## 2024-01-04 NOTE — Patient Instructions (Signed)

## 2024-01-06 LAB — CMP14+EGFR
ALT: 36 IU/L (ref 0–44)
AST: 43 IU/L — ABNORMAL HIGH (ref 0–40)
Albumin: 3.8 g/dL — ABNORMAL LOW (ref 3.9–4.9)
Alkaline Phosphatase: 71 IU/L (ref 44–121)
BUN/Creatinine Ratio: 11 (ref 10–24)
BUN: 11 mg/dL (ref 8–27)
Bilirubin Total: 0.3 mg/dL (ref 0.0–1.2)
CO2: 22 mmol/L (ref 20–29)
Calcium: 8.8 mg/dL (ref 8.6–10.2)
Chloride: 105 mmol/L (ref 96–106)
Creatinine, Ser: 0.96 mg/dL (ref 0.76–1.27)
Globulin, Total: 1.9 g/dL (ref 1.5–4.5)
Glucose: 97 mg/dL (ref 70–99)
Potassium: 4.3 mmol/L (ref 3.5–5.2)
Sodium: 141 mmol/L (ref 134–144)
Total Protein: 5.7 g/dL — ABNORMAL LOW (ref 6.0–8.5)
eGFR: 89 mL/min/{1.73_m2} (ref >59)

## 2024-01-06 LAB — CBC WITH DIFFERENTIAL/PLATELET
Basophils Absolute: 0 10*3/uL (ref 0.0–0.2)
Basos: 1 %
EOS (ABSOLUTE): 0.1 10*3/uL (ref 0.0–0.4)
Eos: 3 %
Hematocrit: 47.3 % (ref 37.5–51.0)
Hemoglobin: 17 g/dL (ref 13.0–17.7)
Immature Grans (Abs): 0 10*3/uL (ref 0.0–0.1)
Immature Granulocytes: 0 %
Lymphocytes Absolute: 1 10*3/uL (ref 0.7–3.1)
Lymphs: 31 %
MCH: 35.1 pg — ABNORMAL HIGH (ref 26.6–33.0)
MCHC: 35.9 g/dL — ABNORMAL HIGH (ref 31.5–35.7)
MCV: 98 fL — ABNORMAL HIGH (ref 79–97)
Monocytes Absolute: 0.5 10*3/uL (ref 0.1–0.9)
Monocytes: 16 %
Neutrophils Absolute: 1.6 10*3/uL (ref 1.4–7.0)
Neutrophils: 49 %
Platelets: 208 10*3/uL (ref 150–450)
RBC: 4.84 x10E6/uL (ref 4.14–5.80)
RDW: 11.2 % — ABNORMAL LOW (ref 11.6–15.4)
WBC: 3.3 10*3/uL — ABNORMAL LOW (ref 3.4–10.8)

## 2024-01-06 LAB — PSA: Prostate Specific Ag, Serum: 0.8 ng/mL (ref 0.0–4.0)

## 2024-01-06 LAB — LIPID PANEL WITH LDL/HDL RATIO
Cholesterol, Total: 187 mg/dL (ref 100–199)
HDL: 55 mg/dL (ref >39)
LDL Chol Calc (NIH): 114 mg/dL — ABNORMAL HIGH (ref 0–99)
LDL/HDL Ratio: 2.1 ratio (ref 0.0–3.6)
Triglycerides: 101 mg/dL (ref 0–149)
VLDL Cholesterol Cal: 18 mg/dL (ref 5–40)

## 2024-01-06 LAB — TSH: TSH: 2.19 u[IU]/mL (ref 0.450–4.500)

## 2024-01-07 ENCOUNTER — Telehealth: Payer: Self-pay | Admitting: Family Medicine

## 2024-01-07 NOTE — Telephone Encounter (Signed)
 Copied from CRM [id number]. Topic: Medical Record Request - Provider/Facility Request >> Jan 07, 2024 11:01 AM Tisa Forester wrote: Reason for CRM: [name] from [facility] medical service requesting most recent  office notes from his provider Timothy Carr  for patient regarding an order for a sleep study  [facility] callback # [phone] please fax to [phone]

## 2024-01-08 ENCOUNTER — Telehealth: Payer: Self-pay

## 2024-01-08 NOTE — Telephone Encounter (Signed)
 Copied from CRM [id number]. Topic: Medical Record Request - Provider/Facility Request >> Jan 07, 2024 11:01 AM Tisa Forester wrote: Reason for CRM: [name] from [facility] medical service requesting most recent  office notes from his provider Ubaldo Galt  for patient regarding an order for a sleep study  [facility] callback # [phone] please fax to [phone] >> Jan 08, 2024 11:10 AM Tilton Fontan wrote: [facility] called to request office notes. Patient was recently referred to [facility] for sleep study. Jenine Mix of turnaround time.   Phone: [phone] Fax: [phone]

## 2024-01-08 NOTE — Telephone Encounter (Signed)
 Requested clinical notes have been faxed to [facility] at [phone].

## 2024-01-13 NOTE — Telephone Encounter (Signed)
OV notes were faxed

## 2024-01-14 ENCOUNTER — Ambulatory Visit (INDEPENDENT_AMBULATORY_CARE_PROVIDER_SITE_OTHER): Admitting: Otolaryngology

## 2024-01-14 VITALS — BP 145/91 | HR 73

## 2024-01-14 DIAGNOSIS — J3089 Other allergic rhinitis: Secondary | ICD-10-CM

## 2024-01-14 DIAGNOSIS — R0981 Nasal congestion: Secondary | ICD-10-CM

## 2024-01-14 DIAGNOSIS — H6123 Impacted cerumen, bilateral: Secondary | ICD-10-CM | POA: Diagnosis not present

## 2024-01-14 NOTE — Progress Notes (Signed)
 ENT Progress Note:   Update 01/14/2024  Discussed the use of AI scribe software for clinical note transcription with the patient, who gave verbal consent to proceed.  History of Present Illness   Timothy Carr is a 63 year old male who presents with ear congestion/blockage and wax buildup.  He experiences a persistent sensation of being 'in a fog' due to ear congestion. He has been dealing with earwax buildup, which he has attempted to manage on his own. Despite these efforts, there remains significant wax accumulation that is dried and hard, causing discomfort during cleaning attempts. Denies hearing changes. Denies dizziness or vertigo.   He has a history of chronic nasal congestion, which has shown significant improvement since his last office visit. He is uncertain about a scheduled appointment with an allergy specialist  He resides in [place], [place] , and works in [place], preferring to attend medical appointments in [place] for convenience.  Records Reviewed:  Initial Evaluation  Reason for Consult: chronic nasal congestion and post-nasal drainage, phlegm   HPI: Discussed the use of AI scribe software for clinical note transcription with the patient, who gave verbal consent to proceed.  History of Present Illness Timothy Carr is a 63 year old male who presents with chronic nasal congestion and postnasal drainage/ sensation of phlegm in his throat.  He has experienced chronic nasal congestion and postnasal drainage since October, 2024, with a persistent sensation of phlegm or buildup in his nose and throat, particularly bothersome in the mornings, often leading to nausea due to drainage into his throat. Symptoms have improved since last year but remain present and are described as 'very draining.' No history of heartburn or reflux, but there is a recent increase in heartburn symptoms. A chest x-ray performed in October was clear.  He uses over-the-counter  medications including a generic version of Nasacort once daily, Mucinex , and an allergy pill. Additionally, he uses albuterol  spray, although he does not report significant breathing difficulties, just a feeling of congestion. He has previously been prescribed a prednisone  pack and other steroids, which did not provide relief.  He reports his ears feeling stuffed up and has attempted to remove earwax at home. No history of smoking. He has lived in the same house since 1991, with no recent changes in environment.  Occasional coughing occurs, especially when drainage occurs into his throat. No significant breathing difficulties.   Records Reviewed:  PCP office visit 08/06/23 Timothy Carr is a  63 y.o. male here today with continued cough.  He has had cough for about 2 month.  Treated with doxycyline and prednisone  last month.  Albuterol  as needed was added as well.  CXR unremarkable.  Denies fever or chills. He has not had dyspnea.  He does feel that the prednisone  helped previously.   Subacute cough He did seem to have improvement with steroids.  Adding longer taper of prednisone  and will add wixela inhaler.  Recommend addition of flonase  daily.  If not improving with this will plan to refer to pulmonology.       Past Medical History:  Diagnosis Date   COVID-19 10/2020    No past surgical history on file.  Family History  Problem Relation Age of Onset   Healthy Mother    Heart failure Father    Cancer Maternal Grandmother    Stroke Maternal Grandmother     Social History:  reports that he has never smoked. He has never used smokeless tobacco. He reports current alcohol  use. He reports that he does not use drugs.  Allergies: No Known Allergies  Medications: I have reviewed the patient's current medications.  The PMH, PSH, Medications, Allergies, and SH were reviewed and updated.  ROS: Constitutional: Negative for fever, weight loss and weight gain. Cardiovascular: Negative  for chest pain and dyspnea on exertion. Respiratory: Is not experiencing shortness of breath at rest. Gastrointestinal: Negative for nausea and vomiting. Neurological: Negative for headaches. Psychiatric: The patient is not nervous/anxious  Blood pressure (!) 145/91, pulse 73, SpO2 96%. There is no height or weight on file to calculate BMI.  PHYSICAL EXAM:  Exam: General: Well-developed, well-nourished Respiratory Respiratory effort: Equal inspiration and expiration without stridor Cardiovascular Peripheral Vascular: Warm extremities with equal color/perfusion Eyes: No nystagmus with equal extraocular motion bilaterally Neuro/Psych/Balance: Patient oriented to person, place, and time; Appropriate mood and affect; Gait is intact with no imbalance; Cranial nerves I-XII are intact Head and Face Inspection: Normocephalic and atraumatic without mass or lesion Palpation: Facial skeleton intact without bony stepoffs Salivary Glands: No mass or tenderness Facial Strength: Facial motility symmetric and full bilaterally ENT Pinna: External ear intact and fully developed External canal: Canals with cerumen impaction b/l, removed no edema or erythema in EACs b/l TM: clear bilaterally  External Nose: No scar or anatomic deformity Bilateral inferior turbinate hypertrophy.  Lips, Teeth, and gums: Mucosa and teeth intact and viable TMJ: No pain to palpation with full mobility Oral cavity/oropharynx: No erythema or exudate, no lesions present  Procedure: Procedure: Cerumen Removal, Bilateral (CPT I2977979)  Diagnosis: cerumen impaction, bilateral   Informed consent: Timeout performed and informed consent was obtained.  Procedure: Operating microscope was employed to evaluate the ear(s).  Cerumen curette, speculum and suction were employed to clear the cerumen.   Findings: Normal appearing tympanic membranes without perforations, and external canals are normal after removal of cerumen.No  middle ear fluid bilaterally.   Complications: None. Patient tolerated well.    Studies Reviewed: CXR 07/10/23  FINDINGS: The heart size and mediastinal contours are within normal limits. Both lungs are clear. The visualized skeletal structures are unremarkable.   IMPRESSION: No active cardiopulmonary disease.  Assessment/Plan: Encounter Diagnoses  Name Primary?   Bilateral impacted cerumen Yes   Environmental and seasonal allergies    Chronic nasal congestion     Assessment and Plan Assessment & Plan Chronic nasal congestion Postnasal drainage likely due to environmental allergies Chronic postnasal drainage with phlegm sensation suggests allergic etiology vs GERD LPR. Flexible laryngoscopy and nasal endoscopy w/o pus or purulence, and no pooling of phlegm or secretions in hypopharynx. He had findings c/w GERD LPR and mucosal edema/post-nasal drainage b/l on nasal endoscopy. Current treatment provides partial relief. - Prescribe Xyzal  5 mg QHS for allergy management. - Prescribe Flonase  nasal spray 2 puffs b/l nares BID - Refer for allergy testing. - Advised nasal rinses with NeilMed bottle.  Chronic GERD LPR Symptoms suggest silent reflux with recent heartburn increase and flexible scope exam findings c/w it. Trial of reflux medication recommended. - Prescribed Pepcid  20 mg BID - Recommend dietary and lifestyle modifications. - Advise use of Reflux Gourmet supplement after meals.   Cerumen impaction, b/l Earwax noted on exam Ear cleaning recommended. - Schedule appointment for ear cleaning  - Advise use of Debrox ear drops once daily for 2-4 weeks prior to ear cleaning   Follow-up Follow-up plans to monitor symptom control and adjust treatment. - Schedule follow-up appointment after allergy testing and ear cleaning.  Update 01/14/2024  Assessment &  Plan   Impacted cerumen b/l  Chronic impacted cerumen causing fullness and discomfort. Cerumen is dried and hard,  complicating removal. S/p removal today and ear exam is unremarkable after removal.  - Advise use of Debrox drops as needed to soften earwax. - Instruct to avoid using Q-tips and earbuds to prevent further impaction.  Artice Last, MD Otolaryngology [facility] Phone: [phone] Fax: [phone]    01/14/2024, 11:26 AM

## 2024-01-15 ENCOUNTER — Telehealth: Payer: Self-pay

## 2024-01-15 NOTE — Telephone Encounter (Signed)
 Copied from CRM [id number]. Topic: Referral - Status >> Jan 15, 2024  2:00 PM Shamecia H wrote: Reason for CRM: Patient called in and wants to cancel the request for the sleep test and he wants to move on to the next steps based on the lab results, patient doesn't think the test is that important to pay over $200 for, patients callback number is [phone]

## 2024-01-19 ENCOUNTER — Other Ambulatory Visit: Payer: Self-pay | Admitting: Family Medicine

## 2024-01-19 ENCOUNTER — Encounter: Payer: Self-pay | Admitting: Family Medicine

## 2024-01-19 DIAGNOSIS — D72819 Decreased white blood cell count, unspecified: Secondary | ICD-10-CM

## 2024-01-19 DIAGNOSIS — R748 Abnormal levels of other serum enzymes: Secondary | ICD-10-CM

## 2024-01-19 NOTE — Telephone Encounter (Signed)
 Patient chooses not to do sleep study test- he just wants to know provider response regarding lab work.

## 2024-02-12 ENCOUNTER — Ambulatory Visit: Admitting: Allergy

## 2024-02-12 ENCOUNTER — Encounter: Payer: Self-pay | Admitting: Allergy

## 2024-02-12 ENCOUNTER — Other Ambulatory Visit: Payer: Self-pay

## 2024-02-12 VITALS — BP 118/82 | HR 71 | Temp 98.4°F | Resp 20 | Ht 77.0 in | Wt 325.1 lb

## 2024-02-12 DIAGNOSIS — Z77098 Contact with and (suspected) exposure to other hazardous, chiefly nonmedicinal, chemicals: Secondary | ICD-10-CM | POA: Diagnosis not present

## 2024-02-12 DIAGNOSIS — J4540 Moderate persistent asthma, uncomplicated: Secondary | ICD-10-CM | POA: Diagnosis not present

## 2024-02-12 DIAGNOSIS — H1013 Acute atopic conjunctivitis, bilateral: Secondary | ICD-10-CM

## 2024-02-12 DIAGNOSIS — H109 Unspecified conjunctivitis: Secondary | ICD-10-CM

## 2024-02-12 DIAGNOSIS — J310 Chronic rhinitis: Secondary | ICD-10-CM

## 2024-02-12 MED ORDER — OLOPATADINE HCL 0.2 % OP SOLN: OPHTHALMIC | 3 refills | Status: DC

## 2024-02-12 MED ORDER — RYALTRIS 665-25 MCG/ACT NA SUSP: NASAL | 3 refills | Status: DC

## 2024-02-12 MED ORDER — BUDESONIDE-FORMOTEROL FUMARATE 160-4.5 MCG/ACT IN AERO: INHALATION_SPRAY | RESPIRATORY_TRACT | 5 refills | Status: DC

## 2024-02-12 NOTE — Progress Notes (Signed)
 New Patient Note  RE: Timothy Carr MRN: [id number] DOB: [birth year] Date of Office Visit: 02/12/2024  Primary care provider: Adela Holter, DO  Chief Complaint: allergies  History of present illness: Timothy Carr is a 63 y.o. male presenting today for evaluation of allergies.   Discussed the use of AI scribe software for clinical note transcription with the patient, who gave verbal consent to proceed.  He has been experiencing significant sinus congestion, particularly in the mornings, since October. The congestion extends to his throat, sometimes causing a choking sensation. His eyes are watery, and he experiences intermittent sneezing and coughing, though the cough is not persistent.  He has been using Flonase  nasal spray once daily in the morning, as his symptoms are worse upon waking, but it does not provide significant relief. He also takes Xyzal , and feels better overall, however also states he has been taking a generic OTC allergy pill as well with the Xyzal . He has used Afrin in the past but discontinued it after prolonged use.   He uses an albuterol  inhaler daily in the morning.  He has had this inhaler since he had Covid illness several years ago. Previously, he used Wixela until it ran out. He experiences wheezing, particularly in the early morning hours, which is alleviated by drinking water. No chest tightness or difficulty breathing, but the wheezing disrupts his sleep.  He has not tried using a neti pot or saline flush and does not feel he would tolerate use.  He mentions potential mold exposure at work, which may contribute to his symptoms. 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  So he is exposed to chemical in the air.  He has seen ENT, Dr Soldatova for chronic nasal congestion, GERD/LPR, cerumen impaction.  He was recommended to take Xyzal , flonase , pepcid .    Review of  systems: 10pt ROS negative unless noted above in HPI  Past medical history: Past Medical History:  Diagnosis Date   Angio-edema    COVID-19 10/2020    Past surgical history: Past Surgical History:  Procedure Laterality Date   ADENOIDECTOMY     TONSILLECTOMY      Family history:  Family History  Problem Relation Age of Onset   Allergic rhinitis Mother    Healthy Mother    Heart failure Father    Cancer Maternal Grandmother    Stroke Maternal Grandmother     Social history: Lives in a home without carpeting with Heat pump heating and central cooling.  Dog and cat in the home.  No concern for water damage, mildew or roaches in the home.  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  He denies a smoking history   Medication List: Current Outpatient Medications  Medication Sig Dispense Refill   albuterol  (VENTOLIN  HFA) 108 (90 Base) MCG/ACT inhaler Inhale 2 puffs into the lungs every 6 (six) hours as needed for wheezing or shortness of breath. 8 g 0   dextromethorphan-guaiFENesin  (MUCINEX  DM) 30-600 MG 12hr tablet Take 1 tablet by mouth 2 (two) times daily. 28 tablet 0   famotidine  (PEPCID ) 20 MG tablet Take 1 tablet (20 mg total) by mouth 2 (two) times daily. 30 tablet 3   fluticasone  (FLONASE ) 50 MCG/ACT nasal spray Place 2 sprays into both nostrils 2 (two) times daily. 16 g 6   HYDROcodone  bit-homatropine (HYDROMET) 5-1.5 MG/5ML syrup Take  5 mLs by mouth every 6 (six) hours as needed for cough. (Patient not taking: Reported on 02/12/2024) 120 mL 0   levocetirizine (XYZAL  ALLERGY 24HR) 5 MG tablet Take 1 tablet (5 mg total) by mouth every evening. (Patient not taking: Reported on 02/12/2024) 30 tablet 3   No current facility-administered medications for this visit.    Known medication allergies: No Known Allergies   Physical examination: Blood pressure 118/82, pulse 71, temperature 98.4 F (36.9 C), temperature source Temporal, resp. rate 20, height 6\' 5"  (1.956 m),  weight (!) 325 lb 1.6 oz (147.5 kg), SpO2 95%.  General: Alert, interactive, in no acute distress. HEENT: PERRLA, TMs pearly gray, turbinates minimally edematous without discharge, post-pharynx unremarkable. Neck: Supple without lymphadenopathy. Lungs: Clear to auscultation without wheezing, rhonchi or rales. {no increased work of breathing. CV: Normal S1, S2 without murmurs. Abdomen: Nondistended, nontender. Skin: Warm and dry, without lesions or rashes. Extremities:  No clubbing, cyanosis or edema. Neuro:   Grossly intact.  Diagnostics/Labs:  Spirometry: FEV1: 3.38L 79%, FVC: 4.61L 81%, ratio consistent with nonobstructive pattern  Assessment and plan: Rhinoconjunctivitis, presumed allergic Chemical exposures at work - airway irritants Reactive airway, moderate persistent  New onset environmental allergies with sinus congestion, throat drainage, watery eyes, sneezing, and cough. Limited relief from current treatment. Potential mold exposure at work as well as chemicals.  Allergy testing needed to identify specific allergens and guide treatment. - Perform allergy testing to identify specific allergens. Use skin testing if lung function is adequate; otherwise, use blood testing. - Prescribe Ryaltris nasal spray for congestion and mucus production.  This prescription is sent to a specialty pharmacy and gets mailed. Ryaltris 2 sprays each nostril twice a day as needed for runny or stuffy nose.   With using nasal sprays point tip of bottle toward eye on same side nostril and lean head slightly forward for best technique.   - Continue Xyzal  5mg  daily, with option to double dose if needed. - Provide eye drops for eye symptoms.  Pataday 1 drop each eye daily as needed for itchy/watery eyes. - Discuss potential for allergy shots pending allergy testing results.  - Discontinue daily use of albuterol  inhaler; reserve for rescue use only. Albuterol  inhaler 2 puffs every 4-6 hours as needed for  cough/wheeze/shortness of breath/chest tightness.  May use 15-20 minutes prior to activity.   Monitor frequency of use.   - Start Symbicort 160mcg 2 puffs twice a day with spacer use for airway control. - Lung function test is normal!  Breathing control goals:  Full participation in all desired activities (may need albuterol  before activity) Albuterol  use two time or less a week on average (not counting use with activity) Cough interfering with sleep two time or less a month Oral steroids no more than once a year No hospitalizations  Schedule skin testing visit for environmental allergies and hold antihistamines (like Xyzal ) for 3 days prior to this visit.  (Environment 1-55)  Follow-up in 3-4 months or sooner if needed   I appreciate the opportunity to take part in [place]'s care. Please do not hesitate to contact me wih questions.  Sincerely,   Catha Clink, MD Allergy/Immunology [facility]

## 2024-02-12 NOTE — Patient Instructions (Addendum)
 Environmental allergies Chemical exposures at work - airway irritants Reactive airway  New onset environmental allergies with sinus congestion, throat drainage, watery eyes, sneezing, and cough. Limited relief from current treatment. Potential mold exposure at work as well as chemicals.  Allergy testing needed to identify specific allergens and guide treatment. - Perform allergy testing to identify specific allergens. Use skin testing if lung function is adequate; otherwise, use blood testing. - Prescribe Ryaltris nasal spray for congestion and mucus production.  This prescription is sent to a specialty pharmacy and gets mailed. Ryaltris 2 sprays each nostril twice a day as needed for runny or stuffy nose.   With using nasal sprays point tip of bottle toward eye on same side nostril and lean head slightly forward for best technique.   - Continue Xyzal  5mg  daily, with option to double dose if needed. - Provide eye drops for eye symptoms.  Pataday 1 drop each eye daily as needed for itchy/watery eyes. - Discuss potential for allergy shots pending allergy testing results.  - Discontinue daily use of albuterol  inhaler; reserve for rescue use only. Albuterol  inhaler 2 puffs every 4-6 hours as needed for cough/wheeze/shortness of breath/chest tightness.  May use 15-20 minutes prior to activity.   Monitor frequency of use.   - Start Symbicort 160mcg 2 puffs twice a day with spacer use for airway control. - Lung function test is normal!  Breathing control goals:  Full participation in all desired activities (may need albuterol  before activity) Albuterol  use two time or less a week on average (not counting use with activity) Cough interfering with sleep two time or less a month Oral steroids no more than once a year No hospitalizations  Schedule skin testing visit for environmental allergies and hold antihistamines (like Xyzal ) for 3 days prior to this visit.   Follow-up in 3-4 months or sooner if  needed

## 2024-02-12 NOTE — Addendum Note (Signed)
 Addended by: Merwyn Achilles on: 02/12/2024 03:36 PM   Modules accepted: Orders

## 2024-02-12 NOTE — Addendum Note (Signed)
 Addended by: Merwyn Achilles on: 02/12/2024 03:40 PM   Modules accepted: Orders

## 2024-02-16 ENCOUNTER — Telehealth: Payer: Self-pay

## 2024-02-16 NOTE — Telephone Encounter (Signed)
*  Asthma/Allergy  Pharmacy Patient Advocate Encounter   Received notification from CoverMyMeds that prior authorization for Ryaltris  665-25MCG/ACT suspension  is required/requested.   Insurance verification completed.   The patient is insured through [facility] .   Per test claim: PA required; PA submitted to above mentioned insurance via CoverMyMeds Key/confirmation #/EOC [id number] Status is pending

## 2024-02-16 NOTE — Telephone Encounter (Signed)
 Pharmacy Patient Advocate Encounter  Received notification from [employer] that Prior Authorization for Ryaltris  665-25MCG/ACT suspension  has been DENIED.  Full denial letter will be uploaded to the media tab. See denial reason below.   The requested medication and/or diagnosis are not a covered benefit and excluded from coverage in accordance with the terms and conditions of your plan benefit. Therefore, the request has been administratively denied. If applicable, below are some alternative medications to consider: OLOPATADINE  HCL

## 2024-02-18 ENCOUNTER — Ambulatory Visit: Admitting: Allergy

## 2024-02-23 NOTE — Progress Notes (Unsigned)
   Skin testing note  RE: Timothy Carr MRN: [id number] DOB: [birth year] Date of Office Visit: 02/24/2024  Referring provider: Adela Holter, DO Primary care provider: Adela Holter, DO  Chief Complaint: skin testing  History of Present Illness: I had the pleasure of seeing Timothy Carr for a skin testing visit at the [facility] on 02/23/2024. He is a 63 y.o. male, who is being followed for rhinoconjunctivitis and reactive airway disease. His previous allergy office visit was on 02/12/2024 with Dr. Tempie Fee. Today is a skin testing visit.   Discussed the use of AI scribe software for clinical note transcription with the patient, who gave verbal consent to proceed.  History of Present Illness             Rhinoconjunctivitis, presumed allergic Chemical exposures at work - airway irritants Reactive airway, moderate persistent   New onset environmental allergies with sinus congestion, throat drainage, watery eyes, sneezing, and cough. Limited relief from current treatment. Potential mold exposure at work as well as chemicals.  Allergy testing needed to identify specific allergens and guide treatment. - Perform allergy testing to identify specific allergens. Use skin testing if lung function is adequate; otherwise, use blood testing. - Prescribe Ryaltris  nasal spray for congestion and mucus production.  This prescription is sent to a specialty pharmacy and gets mailed. Ryaltris  2 sprays each nostril twice a day as needed for runny or stuffy nose.   With using nasal sprays point tip of bottle toward eye on same side nostril and lean head slightly forward for best technique.   - Continue Xyzal  5mg  daily, with option to double dose if needed. - Provide eye drops for eye symptoms.  Pataday  1 drop each eye daily as needed for itchy/watery eyes. - Discuss potential for allergy shots pending allergy testing results.   - Discontinue daily use of albuterol  inhaler; reserve  for rescue use only. Albuterol  inhaler 2 puffs every 4-6 hours as needed for cough/wheeze/shortness of breath/chest tightness.  May use 15-20 minutes prior to activity.   Monitor frequency of use.   - Start Symbicort  160mcg 2 puffs twice a day with spacer use for airway control. - Lung function test is normal!   Breathing control goals:  Full participation in all desired activities (may need albuterol  before activity) Albuterol  use two time or less a week on average (not counting use with activity) Cough interfering with sleep two time or less a month Oral steroids no more than once a year No hospitalizations   Schedule skin testing visit for environmental allergies and hold antihistamines (like Xyzal ) for 3 days prior to this visit.  (Environment 1-55) Assessment and Plan: Timothy Carr is a 63 y.o. male with: ***  Assessment and Plan              No follow-ups on file.  No orders of the defined types were placed in this encounter.  Lab Orders  No laboratory test(s) ordered today    Diagnostics: Skin Testing: Environmental allergy panel. *** Results discussed with patient/family.   Previous notes and tests were reviewed. The plan was reviewed with the patient/family, and all questions/concerned were addressed.  It was my pleasure to see Timothy Carr today and participate in his care. Please feel free to contact me with any questions or concerns.  Sincerely,  Eudelia Hero, DO Allergy & Immunology  [facility]  [place] office: [phone] [facility] office: [phone]

## 2024-02-24 ENCOUNTER — Ambulatory Visit: Admitting: Allergy

## 2024-02-24 DIAGNOSIS — J3089 Other allergic rhinitis: Secondary | ICD-10-CM

## 2024-02-24 DIAGNOSIS — J4540 Moderate persistent asthma, uncomplicated: Secondary | ICD-10-CM

## 2024-02-24 DIAGNOSIS — Z77098 Contact with and (suspected) exposure to other hazardous, chiefly nonmedicinal, chemicals: Secondary | ICD-10-CM

## 2024-02-24 DIAGNOSIS — H1013 Acute atopic conjunctivitis, bilateral: Secondary | ICD-10-CM

## 2024-03-25 ENCOUNTER — Ambulatory Visit: Admitting: Allergy

## 2024-04-13 ENCOUNTER — Other Ambulatory Visit (INDEPENDENT_AMBULATORY_CARE_PROVIDER_SITE_OTHER): Payer: Self-pay | Admitting: Otolaryngology

## 2024-05-17 ENCOUNTER — Encounter: Payer: Self-pay | Admitting: Sports Medicine

## 2024-06-17 ENCOUNTER — Other Ambulatory Visit (INDEPENDENT_AMBULATORY_CARE_PROVIDER_SITE_OTHER): Payer: Self-pay | Admitting: Otolaryngology

## 2024-07-29 ENCOUNTER — Other Ambulatory Visit (INDEPENDENT_AMBULATORY_CARE_PROVIDER_SITE_OTHER): Payer: Self-pay | Admitting: Otolaryngology

## 2024-08-08 ENCOUNTER — Encounter: Payer: Self-pay | Admitting: Medical-Surgical

## 2024-08-08 ENCOUNTER — Ambulatory Visit: Admitting: Medical-Surgical

## 2024-08-08 VITALS — BP 134/91 | HR 99 | Temp 98.7°F | Resp 20 | Ht 77.0 in | Wt 325.0 lb

## 2024-08-08 DIAGNOSIS — J329 Chronic sinusitis, unspecified: Secondary | ICD-10-CM | POA: Diagnosis not present

## 2024-08-08 DIAGNOSIS — J40 Bronchitis, not specified as acute or chronic: Secondary | ICD-10-CM | POA: Diagnosis not present

## 2024-08-08 MED ORDER — HYDROCODONE BIT-HOMATROP MBR 5-1.5 MG/5ML PO SOLN: 5.0000 mL | Freq: Four times a day (QID) | ORAL | 0 refills | Status: DC | PRN

## 2024-08-08 MED ORDER — METHYLPREDNISOLONE 4 MG PO TBPK: ORAL_TABLET | ORAL | 0 refills | Status: DC

## 2024-08-08 MED ORDER — AMOXICILLIN-POT CLAVULANATE 875-125 MG PO TABS: 1.0000 | ORAL_TABLET | Freq: Two times a day (BID) | ORAL | 0 refills | Status: DC

## 2024-08-08 NOTE — Progress Notes (Signed)
        Established patient visit   History of Present Illness   Discussed the use of AI scribe software for clinical note transcription with the patient, who gave verbal consent to proceed.  History of Present Illness   Timothy Carr is a 63 year old male who presents with persistent cough and congestion for three weeks.  Upper respiratory symptoms - Persistent cough and congestion for three weeks - Significant nasal congestion, especially in the morning, requiring frequent nose blowing - Runny nose improves during the day - Chest and sinus pressure present, occasionally leading to nausea - No fever or chills - Morning eye drainage and redness with sensation of cloudiness - No relief with over-the-counter cough drops, cough syrup, or daily allergy pill - Previously used inhaler regularly but has run out - Current medication includes Xyzal    Physical Exam   Physical Exam Vitals reviewed.  Constitutional:      General: He is not in acute distress.    Appearance: Normal appearance.  HENT:     Head: Normocephalic and atraumatic.     Right Ear: Tympanic membrane, ear canal and external ear normal. There is no impacted cerumen.     Left Ear: Tympanic membrane, ear canal and external ear normal. There is no impacted cerumen.     Nose: Congestion present.     Mouth/Throat:     Mouth: Mucous membranes are moist.     Pharynx: Posterior oropharyngeal erythema present.  Eyes:     General:        Right eye: Discharge present.        Left eye: Discharge present.    Pupils: Pupils are equal, round, and reactive to light.  Cardiovascular:     Rate and Rhythm: Normal rate and regular rhythm.     Pulses: Normal pulses.     Heart sounds: Normal heart sounds. No murmur heard.    No friction rub. No gallop.  Pulmonary:     Effort: Pulmonary effort is normal. No respiratory distress.     Breath sounds: Normal breath sounds.  Musculoskeletal:     Cervical back: Neck supple.   Lymphadenopathy:     Cervical: No cervical adenopathy.  Skin:    General: Skin is warm and dry.  Neurological:     Mental Status: He is alert and oriented to person, place, and time.  Psychiatric:        Mood and Affect: Mood normal.        Behavior: Behavior normal.        Thought Content: Thought content normal.        Judgment: Judgment normal.    Assessment & Plan   Sinobronchitis Upper respiratory symptoms persistent for three weeks with ineffective prior treatments. Initiated antibiotics due to prolonged symptoms. - Prescribed Augmentin  twice daily for 10 days. - Prescribed Medrol  Dosepak. - Prescribed Hydromet cough syrup for symptomatic relief.     Follow up   Return if symptoms worsen or fail to improve. __________________________________ Zada FREDRIK Palin, DNP, APRN, FNP-BC Primary Care and Sports Medicine [facility] [place]

## 2024-09-14 ENCOUNTER — Encounter: Payer: Self-pay | Admitting: Family Medicine

## 2024-09-14 ENCOUNTER — Ambulatory Visit

## 2024-09-14 ENCOUNTER — Ambulatory Visit: Admitting: Family Medicine

## 2024-09-14 VITALS — BP 135/82 | HR 101 | Ht 77.0 in | Wt 331.0 lb

## 2024-09-14 DIAGNOSIS — R058 Other specified cough: Secondary | ICD-10-CM

## 2024-09-14 DIAGNOSIS — R059 Cough, unspecified: Secondary | ICD-10-CM | POA: Diagnosis not present

## 2024-09-14 MED ORDER — BENZONATATE 200 MG PO CAPS: 200.0000 mg | ORAL_CAPSULE | Freq: Three times a day (TID) | ORAL | 0 refills | Status: AC | PRN

## 2024-09-14 MED ORDER — BUDESONIDE-FORMOTEROL FUMARATE 160-4.5 MCG/ACT IN AERO: INHALATION_SPRAY | RESPIRATORY_TRACT | 5 refills | Status: DC

## 2024-09-14 NOTE — Progress Notes (Signed)
 "  Acute Office Visit  Patient ID: Timothy Carr, male    DOB: November 01, 1960, 63 y.o.   MRN: [id number]  PCP: Alvia Bring, DO  Chief Complaint  Patient presents with   Cough    Subjective:     HPI  Discussed the use of AI scribe software for clinical note transcription with the patient, who gave verbal consent to proceed.  History of Present Illness Timothy Carr is a 63 year old male who presents with a persistent cough and sinus congestion.  Cough - Persistent dry cough for several weeks - Initially improved with antibiotics and prednisone , but worsened after discontinuation - Exacerbated by deep breaths and talking on the phone - No significant sputum production - No shortness of breath - Deep breaths trigger coughing  Sinus congestion and postnasal drainage - Sinus congestion, especially in the mornings - Drainage into the throat causes nausea - Over-the-counter medications provide some relief as the day progresses - No fever, chills, or new symptoms acquired during the holidays - Felt cold the previous night but no significant chills or fever  Pharyngitis - Scratchy throat started two days ago - No significant sore throat  Use of inhaled medications - Has not been using inhalers regularly - Last used Symbicort  over a week ago - No diagnosis of asthma but has used Symbicort  previously for lung inflammation  Dermatological history - History of polyp removal - Crusty lesion on the ear   ROS     Objective:    BP 135/82   Pulse (!) 101   Ht 6' 5 (1.956 m)   Wt (!) 331 lb (150.1 kg)   SpO2 96%   PF 750 L/min Comment: green zone [id number]  BMI 39.25 kg/m    Physical Exam Constitutional:      Appearance: Normal appearance.  HENT:     Head: Normocephalic and atraumatic.     Right Ear: Tympanic membrane, ear canal and external ear normal. There is no impacted cerumen.     Left Ear: Tympanic membrane, ear canal and external ear normal. There is no  impacted cerumen.     Nose: Nose normal.     Mouth/Throat:     Pharynx: Oropharynx is clear.  Eyes:     Conjunctiva/sclera: Conjunctivae normal.  Cardiovascular:     Rate and Rhythm: Normal rate and regular rhythm.  Pulmonary:     Effort: Pulmonary effort is normal.     Breath sounds: Normal breath sounds.     Comments: Mild expiratory wheeze  bilaterally.  Musculoskeletal:     Cervical back: Neck supple. No tenderness.  Lymphadenopathy:     Cervical: No cervical adenopathy.  Skin:    General: Skin is warm and dry.  Neurological:     Mental Status: He is alert and oriented to person, place, and time.  Psychiatric:        Mood and Affect: Mood normal.       No results found for any visits on 09/14/24.     Assessment & Plan:   Problem List Items Addressed This Visit   None Visit Diagnoses       Post-viral cough syndrome    -  Primary   Relevant Medications   budesonide -formoterol  (SYMBICORT ) 160-4.5 MCG/ACT inhaler   benzonatate  (TESSALON ) 200 MG capsule   Other Relevant Orders   DG Chest 2 View       Assessment and Plan Assessment & Plan Postinfectious cough syndrome Persistent dry cough worsened after stopping  antibiotics and prednisone . No active infection suspected. I do think this is most consistent with a postinfectious cough at this point. - Continue famotidine  for reflux. - Add Tessalon  Perles daytime. - Restart Symbicort  for airway inflammation. - Order chest x-ray to rule out other causes.   Meds ordered this encounter  Medications   budesonide -formoterol  (SYMBICORT ) 160-4.5 MCG/ACT inhaler    Sig: 2 puffs twice a day with spacer use for airway control.    Dispense:  1 each    Refill:  5   benzonatate  (TESSALON ) 200 MG capsule    Sig: Take 1 capsule (200 mg total) by mouth 3 (three) times daily as needed for cough.    Dispense:  75 capsule    Refill:  0    No follow-ups on file.  Dorothyann Byars, MD [facility] Primary Care & Sports  Medicine at [facility]   "

## 2024-09-21 ENCOUNTER — Ambulatory Visit: Payer: Self-pay

## 2024-09-21 NOTE — Telephone Encounter (Signed)
 FYI Only or Action Required?: Action required by provider: clinical question for provider. Xray results And pt is scheduled for appt 1.8.26  Patient was last seen in primary care on 09/14/2024 by Alvan Dorothyann BIRCH, MD.  Called Nurse Triage reporting Cough.  Symptoms began several weeks ago.  Interventions attempted: pearles.  Symptoms are: gradually worsening.  Triage Disposition: See Physician Within 24 Hours  Patient/caregiver understands and will follow disposition?: Yes  Copied from CRM #[id number]. Topic: Clinical - Red Word Triage >> Sep 21, 2024 12:00 PM Aleatha C wrote: Red Word that prompted transfer to Nurse Triage: Been having a bad cough for weeks , got medication and it hasn't helped, cough has gotten worst Reason for Disposition  [1] Continuous (nonstop) coughing interferes with work or school AND [2] no improvement using cough treatment per Care Advice  Answer Assessment - Initial Assessment Questions Seen 09/14/24, had xray which has not been released yet and he is upset he hasn't been called with these results. He states the pearls he was given do not help. The inhaler he was prescribed is not covered by insurance and pharmacy was supposed send over a message asking for it to be changed. Cough is triggered by deep breath, he state he coughs up phlegm in the morning but thinks its from nasal drainage. He states mid upper right side of back he has some intermittent pain. Cough is getting worse, states he does have wheezing but had that last week when he was seen and that's why the inhaler was prescribed. No worse, coughing is just worse    1. ONSET: When did the cough begin?      A couple of weeks ago 2. SEVERITY: How bad is the cough today?      moderate 3. SPUTUM: Describe the color of your sputum (e.g., none, dry cough; clear, white, yellow, green)     In the ams 4. HEMOPTYSIS: Are you coughing up any blood? If Yes, ask: How much? (e.g., flecks, streaks,  tablespoons, etc.)     denies 5. DIFFICULTY BREATHING: Are you having difficulty breathing? If Yes, ask: How bad is it? (e.g., mild, moderate, severe)      Not really short of breath but taking a deep breath induces coughing 6. FEVER: Do you have a fever? If Yes, ask: What is your temperature, how was it measured, and when did it start?     denies 7. CARDIAC HISTORY: Do you have any history of heart disease? (e.g., heart attack, congestive heart failure)      no 8. LUNG HISTORY: Do you have any history of lung disease?  (e.g., pulmonary embolus, asthma, emphysema)     no 9. OTHER SYMPTOMS: Do you have any other symptoms? (e.g., runny nose, wheezing, chest pain)       wheezing  Protocols used: Cough - Acute Non-Productive-A-AH

## 2024-09-22 ENCOUNTER — Encounter: Payer: Self-pay | Admitting: Family Medicine

## 2024-09-22 ENCOUNTER — Ambulatory Visit: Admitting: Family Medicine

## 2024-09-22 ENCOUNTER — Ambulatory Visit: Payer: Self-pay | Admitting: Family Medicine

## 2024-09-22 VITALS — BP 128/85 | HR 117 | Ht 77.0 in | Wt 331.0 lb

## 2024-09-22 DIAGNOSIS — J329 Chronic sinusitis, unspecified: Secondary | ICD-10-CM

## 2024-09-22 DIAGNOSIS — R058 Other specified cough: Secondary | ICD-10-CM

## 2024-09-22 DIAGNOSIS — R052 Subacute cough: Secondary | ICD-10-CM | POA: Diagnosis not present

## 2024-09-22 DIAGNOSIS — J40 Bronchitis, not specified as acute or chronic: Secondary | ICD-10-CM

## 2024-09-22 MED ORDER — FLUTICASONE-SALMETEROL 100-50 MCG/ACT IN AEPB: 1.0000 | INHALATION_SPRAY | Freq: Two times a day (BID) | RESPIRATORY_TRACT | 3 refills | Status: AC

## 2024-09-22 MED ORDER — AMOXICILLIN-POT CLAVULANATE 875-125 MG PO TABS: 1.0000 | ORAL_TABLET | Freq: Two times a day (BID) | ORAL | 0 refills | Status: AC

## 2024-09-22 MED ORDER — ALBUTEROL SULFATE HFA 108 (90 BASE) MCG/ACT IN AERS: 2.0000 | INHALATION_SPRAY | Freq: Four times a day (QID) | RESPIRATORY_TRACT | 3 refills | Status: AC | PRN

## 2024-09-22 MED ORDER — GUAIFENESIN-CODEINE 100-10 MG/5ML PO SOLN: 5.0000 mL | Freq: Three times a day (TID) | ORAL | 0 refills | Status: AC | PRN

## 2024-09-22 MED ORDER — METHYLPREDNISOLONE SODIUM SUCC 125 MG IJ SOLR
125.0000 mg | Freq: Once | INTRAMUSCULAR | Status: AC
  Administered 2024-09-22: 125 mg via INTRAMUSCULAR

## 2024-09-22 MED ORDER — AZITHROMYCIN 250 MG PO TABS: ORAL_TABLET | ORAL | 0 refills | Status: AC

## 2024-09-22 MED ORDER — PREDNISONE 20 MG PO TABS: 20.0000 mg | ORAL_TABLET | Freq: Two times a day (BID) | ORAL | 0 refills | Status: AC

## 2024-09-22 NOTE — Progress Notes (Signed)
 " Timothy Carr - 64 y.o. male MRN [id number]  Date of birth: March 05, 1961  Subjective Chief Complaint  Patient presents with   Cough    HPI Timothy Carr is a 64 y.o. male here today with complaint of increased cough.   Reports having cough and wheezing over the past several weeks.  He was seen by Dr. Alvan on 12/31.  CXR completed but has not been read yet.   He is having pain with coughing as well as when taking a deep breath.  He does have continued wheezing and feels that symptoms have worsened since his visit on 12/31.  He was unable to get symbicort  due to PA being needed.  He denies fever or chills.  ROS:  A comprehensive ROS was completed and negative except as noted per HPI  Allergies[1]  Past Medical History:  Diagnosis Date   Angio-edema    COVID-19 10/2020    Past Surgical History:  Procedure Laterality Date   ADENOIDECTOMY     TONSILLECTOMY      Social History   Socioeconomic History   Marital status: Married    Spouse name: Not on file   Number of children: Not on file   Years of education: Not on file   Highest education level: Not on file  Occupational History   Not on file  Tobacco Use   Smoking status: Never   Smokeless tobacco: Never  Vaping Use   Vaping status: Never Used  Substance and Sexual Activity   Alcohol use: Yes    Comment: socially   Drug use: Never   Sexual activity: Yes  Other Topics Concern   Not on file  Social History Narrative   Not on file   Social Drivers of Health   Tobacco Use: Low Risk (09/22/2024)   Patient History    Smoking Tobacco Use: Never    Smokeless Tobacco Use: Never    Passive Exposure: Not on file  Financial Resource Strain: Not on file  Food Insecurity: Not on file  Transportation Needs: Not on file  Physical Activity: Not on file  Stress: Not on file  Social Connections: Not on file  Depression (PHQ2-9): Low Risk (09/14/2024)   Depression (PHQ2-9)    PHQ-2 Score: 0  Alcohol Screen: Not on  file  Housing: Not on file  Utilities: Not on file  Health Literacy: Not on file    Family History  Problem Relation Age of Onset   Allergic rhinitis Mother    Healthy Mother    Heart failure Father    Cancer Maternal Grandmother    Stroke Maternal Grandmother     Health Maintenance  Topic Date Due   HIV Screening  Never done   Hepatitis C Screening  Never done   Colonoscopy  Never done   Influenza Vaccine  12/13/2024 (Originally 04/15/2024)   Zoster Vaccines- Shingrix (1 of 2) 12/21/2024 (Originally 05/20/2010)   COVID-19 Vaccine (3 - 2025-26 season) 05/15/2025 (Originally 05/16/2024)   Pneumococcal Vaccine: 50+ Years (1 of 2 - PCV) 09/22/2025 (Originally 05/21/1979)   DTaP/Tdap/Td (2 - Td or Tdap) 11/04/2033   Hepatitis B Vaccines 19-59 Average Risk  Aged Out   HPV VACCINES  Aged Out   Meningococcal B Vaccine  Aged Out     ----------------------------------------------------------------------------------------------------------------------------------------------------------------------------------------------------------------- Physical Exam BP 128/85   Pulse (!) 117   Ht 6' 5 (1.956 m)   Wt (!) 331 lb (150.1 kg)   SpO2 96%   BMI 39.25 kg/m  Physical Exam Constitutional:      Appearance: Normal appearance.  Eyes:     General: No scleral icterus. Cardiovascular:     Rate and Rhythm: Normal rate and regular rhythm.  Pulmonary:     Effort: Pulmonary effort is normal.     Breath sounds: Normal breath sounds.  Musculoskeletal:     Cervical back: Neck supple.  Neurological:     Mental Status: He is alert.  Psychiatric:        Mood and Affect: Mood normal.        Behavior: Behavior normal.     ------------------------------------------------------------------------------------------------------------------------------------------------------------------------------------------------------------------- Assessment and Plan  Subacute cough Continues to have cough  with wheezing as well as some pain with inspiration.  Reviewed his recent chest x-ray which does show hazy opacity in the right lower lobe by my interpretation.  Treated with combination of Augmentin  and azithromycin .  Injection of Solu-Medrol  given in clinic today.  Will add an additional few days burst of steroids as well.  Inhaler switched from Symbicort  to Advair as this appears to be preferred on his insurance.  Robitussin AC sent in as needed for cough.   Meds ordered this encounter  Medications   albuterol  (VENTOLIN  HFA) 108 (90 Base) MCG/ACT inhaler    Sig: Inhale 2 puffs into the lungs every 6 (six) hours as needed for wheezing or shortness of breath.    Dispense:  8 g    Refill:  3   fluticasone -salmeterol (ADVAIR) 100-50 MCG/ACT AEPB    Sig: Inhale 1 puff into the lungs 2 (two) times daily.    Dispense:  1 each    Refill:  3   predniSONE  (DELTASONE ) 20 MG tablet    Sig: Take 1 tablet (20 mg total) by mouth 2 (two) times daily with a meal for 5 days.    Dispense:  10 tablet    Refill:  0   amoxicillin -clavulanate (AUGMENTIN ) 875-125 MG tablet    Sig: Take 1 tablet by mouth 2 (two) times daily.    Dispense:  20 tablet    Refill:  0   azithromycin  (ZITHROMAX ) 250 MG tablet    Sig: Take 2 tablets on day 1, then 1 tablet daily on days 2 through 5    Dispense:  6 tablet    Refill:  0   guaiFENesin -codeine  100-10 MG/5ML syrup    Sig: Take 5 mLs by mouth 3 (three) times daily as needed for cough.    Dispense:  200 mL    Refill:  0   methylPREDNISolone  sodium succinate (SOLU-MEDROL ) 125 mg/2 mL injection 125 mg    No follow-ups on file.         [1] No Known Allergies  "

## 2024-09-22 NOTE — Assessment & Plan Note (Signed)
 Continues to have cough with wheezing as well as some pain with inspiration.  Reviewed his recent chest x-ray which does show hazy opacity in the right lower lobe by my interpretation.  Treated with combination of Augmentin  and azithromycin .  Injection of Solu-Medrol  given in clinic today.  Will add an additional few days burst of steroids as well.  Inhaler switched from Symbicort  to Advair as this appears to be preferred on his insurance.  Robitussin AC sent in as needed for cough.

## 2024-09-22 NOTE — Progress Notes (Signed)
 Chest xray is normal. No sign of pneumonia or infection or nodules or fluid

## 2024-09-22 NOTE — Patient Instructions (Signed)
 Start combination of augmentin  and azithromycin  Start prednisone  tomorrow.  Stay well hydrated.  Start Advair inhaler.  Let me know if worsening or not improving.

## 2024-10-03 ENCOUNTER — Telehealth: Payer: Self-pay

## 2024-10-03 NOTE — Telephone Encounter (Signed)
 Copied from CRM [id number]. Topic: Clinical - Medication Question >> Oct 03, 2024 11:41 AM [name] wrote: Reason for CRM: Patient was seen on 09/14/24 and 09/22/24, states his symptoms are getting better but not fully gone. Would like to see if could get another prescription for additional antibiotics and steroids to make sure his symptoms are completely gone.   Patient can be reached at [phone]

## 2024-10-04 NOTE — Telephone Encounter (Signed)
 Copied from CRM [id number]. Topic: Clinical - Medication Question >> Oct 03, 2024 11:41 AM Timothy Carr wrote: Reason for CRM: Patient was seen on 09/14/24 and 09/22/24, states his symptoms are getting better but not fully gone. Would like to see if could get another prescription for additional antibiotics and steroids to make sure his symptoms are completely gone.   Patient can be reached at [phone] >> Oct 04, 2024 11:37 AM Joesph B wrote: Please call patient in regards to this. I relayed the message from pcp about continuing antibiotics and he wants to further discuss it with a nurse.

## 2024-10-05 NOTE — Telephone Encounter (Signed)
 Patient is aware voiced his understanding. No questions or concerns
